# Patient Record
Sex: Female | Born: 1950 | Race: White | Hispanic: No | Smoking: Current every day smoker
Health system: Southern US, Community
[De-identification: ages and names within clinical notes are randomized; demographics above are authoritative.]

## PROBLEM LIST (undated history)

## (undated) DIAGNOSIS — Z973 Presence of spectacles and contact lenses: Secondary | ICD-10-CM

## (undated) DIAGNOSIS — N8110 Cystocele, unspecified: Secondary | ICD-10-CM

## (undated) DIAGNOSIS — J410 Simple chronic bronchitis: Secondary | ICD-10-CM

## (undated) DIAGNOSIS — Z8709 Personal history of other diseases of the respiratory system: Secondary | ICD-10-CM

## (undated) DIAGNOSIS — Z853 Personal history of malignant neoplasm of breast: Secondary | ICD-10-CM

---

## 1979-10-06 HISTORY — PX: OTHER SURGICAL HISTORY: SHX169

## 1979-10-15 HISTORY — PX: VAGINAL HYSTERECTOMY: SHX2639

## 1993-02-16 HISTORY — PX: MASTECTOMY WITH AXILLARY LYMPH NODE DISSECTION: SHX5661

## 1994-10-14 HISTORY — PX: OTHER SURGICAL HISTORY: SHX169

## 2013-09-01 ENCOUNTER — Other Ambulatory Visit: Payer: Self-pay | Admitting: Urology

## 2013-09-23 ENCOUNTER — Encounter (HOSPITAL_BASED_OUTPATIENT_CLINIC_OR_DEPARTMENT_OTHER): Payer: Self-pay | Admitting: *Deleted

## 2013-09-23 NOTE — Progress Notes (Signed)
NPO AFTER MN. ARRIVE AT 0830. NEEDS HG. REVIEWED [facility] GUIDELINES.

## 2013-09-27 ENCOUNTER — Ambulatory Visit (HOSPITAL_BASED_OUTPATIENT_CLINIC_OR_DEPARTMENT_OTHER)
Admission: RE | Admit: 2013-09-27 | Discharge: 2013-09-28 | Disposition: A | Discharge status: Home / Self Care | Payer: 59 | Source: Ambulatory Visit | Attending: Urology | Admitting: Urology

## 2013-09-27 ENCOUNTER — Encounter (HOSPITAL_BASED_OUTPATIENT_CLINIC_OR_DEPARTMENT_OTHER): Payer: Self-pay | Admitting: Anesthesiology

## 2013-09-27 ENCOUNTER — Encounter (HOSPITAL_BASED_OUTPATIENT_CLINIC_OR_DEPARTMENT_OTHER)
Admission: RE | Disposition: A | Discharge status: Home / Self Care | Payer: Self-pay | Source: Ambulatory Visit | Attending: Urology

## 2013-09-27 ENCOUNTER — Ambulatory Visit (HOSPITAL_BASED_OUTPATIENT_CLINIC_OR_DEPARTMENT_OTHER): Payer: 59 | Admitting: Anesthesiology

## 2013-09-27 ENCOUNTER — Encounter (HOSPITAL_BASED_OUTPATIENT_CLINIC_OR_DEPARTMENT_OTHER): Payer: 59 | Admitting: Anesthesiology

## 2013-09-27 DIAGNOSIS — N816 Rectocele: Secondary | ICD-10-CM | POA: Insufficient documentation

## 2013-09-27 DIAGNOSIS — R350 Frequency of micturition: Secondary | ICD-10-CM | POA: Insufficient documentation

## 2013-09-27 DIAGNOSIS — R351 Nocturia: Secondary | ICD-10-CM | POA: Insufficient documentation

## 2013-09-27 DIAGNOSIS — Z9079 Acquired absence of other genital organ(s): Secondary | ICD-10-CM | POA: Insufficient documentation

## 2013-09-27 DIAGNOSIS — J42 Unspecified chronic bronchitis: Secondary | ICD-10-CM | POA: Insufficient documentation

## 2013-09-27 DIAGNOSIS — R3911 Hesitancy of micturition: Secondary | ICD-10-CM | POA: Insufficient documentation

## 2013-09-27 DIAGNOSIS — Z853 Personal history of malignant neoplasm of breast: Secondary | ICD-10-CM | POA: Insufficient documentation

## 2013-09-27 DIAGNOSIS — Z90710 Acquired absence of both cervix and uterus: Secondary | ICD-10-CM | POA: Insufficient documentation

## 2013-09-27 DIAGNOSIS — N8111 Cystocele, midline: Secondary | ICD-10-CM | POA: Insufficient documentation

## 2013-09-27 DIAGNOSIS — R3915 Urgency of urination: Secondary | ICD-10-CM | POA: Insufficient documentation

## 2013-09-27 DIAGNOSIS — N3289 Other specified disorders of bladder: Secondary | ICD-10-CM | POA: Insufficient documentation

## 2013-09-27 DIAGNOSIS — F17200 Nicotine dependence, unspecified, uncomplicated: Secondary | ICD-10-CM | POA: Insufficient documentation

## 2013-09-27 DIAGNOSIS — N8110 Cystocele, unspecified: Secondary | ICD-10-CM | POA: Diagnosis present

## 2013-09-27 DIAGNOSIS — M533 Sacrococcygeal disorders, not elsewhere classified: Secondary | ICD-10-CM | POA: Insufficient documentation

## 2013-09-27 HISTORY — DX: Cystocele, unspecified: N81.10

## 2013-09-27 HISTORY — DX: Simple chronic bronchitis: J41.0

## 2013-09-27 HISTORY — DX: Presence of spectacles and contact lenses: Z97.3

## 2013-09-27 HISTORY — PX: CYSTOCELE REPAIR: SHX163

## 2013-09-27 HISTORY — DX: Personal history of malignant neoplasm of breast: Z85.3

## 2013-09-27 HISTORY — DX: Personal history of other diseases of the respiratory system: Z87.09

## 2013-09-27 LAB — POCT HEMOGLOBIN-HEMACUE: Hemoglobin: 14.3 g/dL (ref 12.0–15.0)

## 2013-09-27 SURGERY — COLPORRHAPHY, ANTERIOR, FOR CYSTOCELE REPAIR
Anesthesia: General | Site: Vagina

## 2013-09-27 MED ORDER — PHENAZOPYRIDINE HCL 200 MG PO TABS
200.0000 mg | ORAL_TABLET | Freq: Once | ORAL | Status: AC
  Administered 2013-09-27: 200 mg via ORAL
  Filled 2013-09-27: qty 1

## 2013-09-27 MED ORDER — MEPERIDINE HCL 25 MG/ML IJ SOLN
6.2500 mg | INTRAMUSCULAR | Status: DC | PRN
  Filled 2013-09-27: qty 1

## 2013-09-27 MED ORDER — PROPOFOL 10 MG/ML IV BOLUS
INTRAVENOUS | Status: DC | PRN
  Administered 2013-09-27: 150 mg via INTRAVENOUS
  Administered 2013-09-27: 20 mg via INTRAVENOUS

## 2013-09-27 MED ORDER — FENTANYL CITRATE 0.05 MG/ML IJ SOLN
INTRAMUSCULAR | Status: DC | PRN
  Administered 2013-09-27: 50 ug via INTRAVENOUS
  Administered 2013-09-27: 25 ug via INTRAVENOUS
  Administered 2013-09-27: 50 ug via INTRAVENOUS
  Administered 2013-09-27: 25 ug via INTRAVENOUS
  Administered 2013-09-27: 50 ug via INTRAVENOUS

## 2013-09-27 MED ORDER — DEXAMETHASONE SODIUM PHOSPHATE 4 MG/ML IJ SOLN
INTRAMUSCULAR | Status: DC | PRN
  Administered 2013-09-27: 10 mg via INTRAVENOUS

## 2013-09-27 MED ORDER — ONDANSETRON HCL 4 MG/2ML IJ SOLN
INTRAMUSCULAR | Status: DC | PRN
  Administered 2013-09-27: 4 mg via INTRAVENOUS

## 2013-09-27 MED ORDER — ZOLPIDEM TARTRATE 5 MG PO TABS
5.0000 mg | ORAL_TABLET | Freq: Every evening | ORAL | Status: DC | PRN
  Filled 2013-09-27: qty 1

## 2013-09-27 MED ORDER — OXYCODONE-ACETAMINOPHEN 5-325 MG PO TABS
1.0000 | ORAL_TABLET | ORAL | Status: DC | PRN
  Filled 2013-09-27: qty 2

## 2013-09-27 MED ORDER — LIDOCAINE HCL (CARDIAC) 20 MG/ML IV SOLN
INTRAVENOUS | Status: DC | PRN
  Administered 2013-09-27: 60 mg via INTRAVENOUS

## 2013-09-27 MED ORDER — OXYCODONE-ACETAMINOPHEN 5-325 MG PO TABS: 1.0000 | ORAL_TABLET | ORAL | Status: DC | PRN

## 2013-09-27 MED ORDER — ESTRADIOL 0.1 MG/GM VA CREA: TOPICAL_CREAM | VAGINAL | Status: DC | PRN

## 2013-09-27 MED ORDER — KETOROLAC TROMETHAMINE 30 MG/ML IJ SOLN
INTRAMUSCULAR | Status: DC | PRN
  Administered 2013-09-27: 30 mg via INTRAVENOUS

## 2013-09-27 MED ORDER — EPHEDRINE SULFATE 50 MG/ML IJ SOLN
INTRAMUSCULAR | Status: DC | PRN
  Administered 2013-09-27 (×5): 10 mg via INTRAVENOUS

## 2013-09-27 MED ORDER — BISACODYL 5 MG PO TBEC
5.0000 mg | DELAYED_RELEASE_TABLET | Freq: Every day | ORAL | Status: DC | PRN
  Filled 2013-09-27: qty 1

## 2013-09-27 MED ORDER — FENTANYL CITRATE 0.05 MG/ML IJ SOLN
INTRAMUSCULAR | Status: AC
  Filled 2013-09-27: qty 8

## 2013-09-27 MED ORDER — METOCLOPRAMIDE HCL 5 MG/ML IJ SOLN
INTRAMUSCULAR | Status: DC | PRN
  Administered 2013-09-27: 10 mg via INTRAVENOUS

## 2013-09-27 MED ORDER — PHENAZOPYRIDINE HCL 100 MG PO TABS
ORAL_TABLET | ORAL | Status: AC
  Filled 2013-09-27: qty 2

## 2013-09-27 MED ORDER — MIDAZOLAM HCL 5 MG/5ML IJ SOLN
INTRAMUSCULAR | Status: DC | PRN
  Administered 2013-09-27 (×2): 1 mg via INTRAVENOUS

## 2013-09-27 MED ORDER — CIPROFLOXACIN IN D5W 400 MG/200ML IV SOLN
400.0000 mg | INTRAVENOUS | Status: AC
  Administered 2013-09-27: 400 mg via INTRAVENOUS
  Filled 2013-09-27: qty 200

## 2013-09-27 MED ORDER — CIPROFLOXACIN HCL 500 MG PO TABS
ORAL_TABLET | ORAL | Status: AC
  Filled 2013-09-27: qty 1

## 2013-09-27 MED ORDER — HYDROMORPHONE HCL PF 1 MG/ML IJ SOLN
0.5000 mg | INTRAMUSCULAR | Status: DC | PRN
  Filled 2013-09-27: qty 1

## 2013-09-27 MED ORDER — BELLADONNA ALKALOIDS-OPIUM 16.2-60 MG RE SUPP
RECTAL | Status: AC
  Filled 2013-09-27: qty 1

## 2013-09-27 MED ORDER — CIPROFLOXACIN HCL 500 MG PO TABS
500.0000 mg | ORAL_TABLET | Freq: Two times a day (BID) | ORAL | Status: DC
  Administered 2013-09-27 – 2013-09-28 (×2): 500 mg via ORAL
  Filled 2013-09-27: qty 1

## 2013-09-27 MED ORDER — CIPROFLOXACIN HCL 500 MG PO TABS: 500.0000 mg | ORAL_TABLET | Freq: Two times a day (BID) | ORAL | Status: DC

## 2013-09-27 MED ORDER — BUPIVACAINE-EPINEPHRINE 0.5% -1:200000 IJ SOLN
INTRAMUSCULAR | Status: DC | PRN
  Administered 2013-09-27: 20 mL

## 2013-09-27 MED ORDER — SODIUM CHLORIDE 0.9 % IR SOLN
Status: DC | PRN
  Administered 2013-09-27: 12:00:00

## 2013-09-27 MED ORDER — DIPHENHYDRAMINE HCL 12.5 MG/5ML PO ELIX
12.5000 mg | ORAL_SOLUTION | Freq: Four times a day (QID) | ORAL | Status: DC | PRN
  Filled 2013-09-27: qty 5

## 2013-09-27 MED ORDER — BELLADONNA ALKALOIDS-OPIUM 16.2-60 MG RE SUPP
RECTAL | Status: DC | PRN
  Administered 2013-09-27: 1 via RECTAL

## 2013-09-27 MED ORDER — MIDAZOLAM HCL 2 MG/2ML IJ SOLN
INTRAMUSCULAR | Status: AC
  Filled 2013-09-27: qty 2

## 2013-09-27 MED ORDER — DIPHENHYDRAMINE HCL 50 MG/ML IJ SOLN
12.5000 mg | Freq: Four times a day (QID) | INTRAMUSCULAR | Status: DC | PRN
  Filled 2013-09-27: qty 0.25

## 2013-09-27 MED ORDER — PROMETHAZINE HCL 25 MG/ML IJ SOLN
6.2500 mg | INTRAMUSCULAR | Status: DC | PRN
  Filled 2013-09-27: qty 1

## 2013-09-27 MED ORDER — ACETAMINOPHEN 10 MG/ML IV SOLN
INTRAVENOUS | Status: DC | PRN
  Administered 2013-09-27: 1000 mg via INTRAVENOUS

## 2013-09-27 MED ORDER — SODIUM CHLORIDE 0.9 % IJ SOLN
INTRAMUSCULAR | Status: DC | PRN
  Administered 2013-09-27: 50 mL via INTRAVENOUS

## 2013-09-27 MED ORDER — LACTATED RINGERS IV SOLN
INTRAVENOUS | Status: DC
  Administered 2013-09-27 (×2): via INTRAVENOUS
  Filled 2013-09-27: qty 1000

## 2013-09-27 MED ORDER — ONDANSETRON HCL 4 MG/2ML IJ SOLN
4.0000 mg | INTRAMUSCULAR | Status: DC | PRN
  Filled 2013-09-27: qty 2

## 2013-09-27 MED ORDER — LACTATED RINGERS IV SOLN
INTRAVENOUS | Status: DC
  Filled 2013-09-27: qty 1000

## 2013-09-27 MED ORDER — FENTANYL CITRATE 0.05 MG/ML IJ SOLN
25.0000 ug | INTRAMUSCULAR | Status: DC | PRN
  Filled 2013-09-27: qty 1

## 2013-09-27 MED ORDER — SODIUM CHLORIDE 0.45 % IV SOLN
INTRAVENOUS | Status: DC
  Administered 2013-09-27 – 2013-09-28 (×2): via INTRAVENOUS
  Filled 2013-09-27: qty 1000

## 2013-09-27 MED ORDER — METRONIDAZOLE 0.75 % VA GEL
VAGINAL | Status: DC | PRN
  Administered 2013-09-27: 1 via VAGINAL

## 2013-09-27 MED ORDER — CIPROFLOXACIN IN D5W 400 MG/200ML IV SOLN
INTRAVENOUS | Status: AC
  Filled 2013-09-27: qty 200

## 2013-09-27 MED ORDER — STERILE WATER FOR IRRIGATION IR SOLN
Status: DC | PRN
  Administered 2013-09-27: 1000 mL

## 2013-09-27 SURGICAL SUPPLY — 60 items
BAG URINE DRAINAGE (UROLOGICAL SUPPLIES) ×2 IMPLANT
BLADE SURG 10 STRL SS (BLADE) ×2 IMPLANT
BLADE SURG 15 STRL LF DISP TIS (BLADE) ×1 IMPLANT
BLADE SURG 15 STRL SS (BLADE) ×1
BLADE SURG ROTATE 9660 (MISCELLANEOUS) ×2 IMPLANT
BOOTIES KNEE HIGH SLOAN (MISCELLANEOUS) IMPLANT
CANISTER SUCTION 1200CC (MISCELLANEOUS) IMPLANT
CANISTER SUCTION 2500CC (MISCELLANEOUS) ×4 IMPLANT
CATH FOLEY 2WAY SLVR  5CC 16FR (CATHETERS) ×1
CATH FOLEY 2WAY SLVR 5CC 16FR (CATHETERS) ×1 IMPLANT
CLOTH BEACON ORANGE TIMEOUT ST (SAFETY) ×2 IMPLANT
COVER LIGHT HANDLE  1/PK (MISCELLANEOUS) ×1
COVER LIGHT HANDLE 1/PK (MISCELLANEOUS) ×1 IMPLANT
COVER MAYO STAND STRL (DRAPES) ×2 IMPLANT
COVER TABLE BACK 60X90 (DRAPES) ×2 IMPLANT
DERMABOND ADVANCED (GAUZE/BANDAGES/DRESSINGS)
DERMABOND ADVANCED .7 DNX12 (GAUZE/BANDAGES/DRESSINGS) IMPLANT
DEVICE CAPIO SUTURING (INSTRUMENTS)
DEVICE CAPIO SUTURING OPC (INSTRUMENTS) IMPLANT
DISSECTOR ROUND CHERRY 3/8 STR (MISCELLANEOUS) IMPLANT
DRAPE CAMERA CLOSED 9X96 (DRAPES) ×2 IMPLANT
DRAPE SURG 17X23 STRL (DRAPES) ×4 IMPLANT
DRAPE UNDERBUTTOCKS STRL (DRAPE) ×2 IMPLANT
FLOSEAL 10ML (HEMOSTASIS) IMPLANT
GAUZE SPONGE 4X4 16PLY XRAY LF (GAUZE/BANDAGES/DRESSINGS) IMPLANT
GLOVE BIO SURGEON STRL SZ 6.5 (GLOVE) ×2 IMPLANT
GLOVE BIO SURGEON STRL SZ7.5 (GLOVE) ×8 IMPLANT
GLOVE INDICATOR 6.5 STRL GRN (GLOVE) ×2 IMPLANT
GOWN PREVENTION PLUS LG XLONG (DISPOSABLE) ×2 IMPLANT
GOWN STRL REIN XL XLG (GOWN DISPOSABLE) ×4 IMPLANT
NEEDLE 1/2 CIR CATGUT .05X1.09 (NEEDLE) ×2 IMPLANT
NEEDLE HYPO 22GX1.5 SAFETY (NEEDLE) ×2 IMPLANT
NS IRRIG 500ML POUR BTL (IV SOLUTION) ×2 IMPLANT
PACKING VAGINAL (PACKING) ×2 IMPLANT
PENCIL BUTTON HOLSTER BLD 10FT (ELECTRODE) ×2 IMPLANT
PLUG CATH AND CAP STER (CATHETERS) ×2 IMPLANT
RETRACTOR LONRSTAR 16.6X16.6CM (MISCELLANEOUS) ×1 IMPLANT
RETRACTOR STAY HOOK 5MM (MISCELLANEOUS) ×2 IMPLANT
RETRACTOR STER APS 16.6X16.6CM (MISCELLANEOUS) ×2
SET IRRIG Y TYPE TUR BLADDER L (SET/KITS/TRAYS/PACK) ×2 IMPLANT
SHEET LAVH (DRAPES) ×2 IMPLANT
SLING SOLYX SYSTEM SIS BX (SLING) IMPLANT
SPONGE LAP 4X18 X RAY DECT (DISPOSABLE) ×2 IMPLANT
SUCTION FRAZIER TIP 10 FR DISP (SUCTIONS) ×2 IMPLANT
SUT CAPIO POLYGLYCOLIC (SUTURE) IMPLANT
SUT ETHILON 2 0 PS N (SUTURE) IMPLANT
SUT MON AB 2-0 SH 27 (SUTURE)
SUT MON AB 2-0 SH27 (SUTURE) IMPLANT
SUT NONABSORB MONO DB W/NDL 48 (SUTURE) ×4 IMPLANT
SUT SILK 3 0 PS 1 (SUTURE) IMPLANT
SUT VIC AB 2-0 UR6 27 (SUTURE) ×26 IMPLANT
SYR BULB IRRIGATION 50ML (SYRINGE) ×2 IMPLANT
SYR CONTROL 10ML LL (SYRINGE) ×4 IMPLANT
SYRINGE 10CC LL (SYRINGE) ×2 IMPLANT
SYS PRF KIT VAG SUP UPHOLD (Sling) ×2 IMPLANT
TISSUE REPAIR XENFORM 6X10CM (Tissue) ×2 IMPLANT
TRAY DSU PREP LF (CUSTOM PROCEDURE TRAY) ×2 IMPLANT
TUBE CONNECTING 12X1/4 (SUCTIONS) ×4 IMPLANT
WATER STERILE IRR 500ML POUR (IV SOLUTION) ×2 IMPLANT
YANKAUER SUCT BULB TIP NO VENT (SUCTIONS) IMPLANT

## 2013-09-27 NOTE — Op Note (Signed)
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  Surgeon:  Kathie Rhodes. Patsi Sears, MD  First assistant:  Dr. Lorin Picket MacDiarmid  Anesthesia:  General LMA; Marcaine, 2% with epinephrine, 1:200,000, 80cc.   Preparation: After appropriate preanesthesia, the patient was brought to the operative room, placed in the upper table in the dorsal supine position where general LMA anesthesia was introduced. She was replaced in dorsal lithotomy position with pubis was prepped with Betadine solution and draped in usual fashion.  Review history:  female who underwent hysterectomy in 1981, and with "bladder tack" at that time. She subsequently underwent bilateral salpingo-oophorectomy 1996. She has complained of vaginal pressure and protrusion for several years, but things have "worsened" over the last several months. She complains of urinary frequency, urgency and nocturia. She has obstructive voiding symptoms, mushy decompress the cystocele (dictation), at which point she can void freely, and without difficulty. Otherwise, the patient complains of urinary hesitancy, and weak stream. Her biggest complaint of vaginal pressure protrusion, which she feels comment visually appreciates her prolapse.  Urodynamics shows a capacity of 850 cc first sensation at 395 cc. Leak point pressure is 119 cm water with no leakage with or without reduction of her prolapse.  Pressure flow studies accomplished, and shows void on the greater 26 cc with maximum flow of 20 cc per second. Detrusor pressure at maximum flow was 29 cm water. She is now for anterior vault prolapse  repair.   Statement of  Likelihood of Success: Excellent. TIME-OUT observed.:  Procedure:   Vaginal inspection revealed complete eversion of the patient's bladder, with high suspicion of enterocele. The apex was marked with a 2-0 Vicryl suture, and photodocumentation was accomplished. The patient's anterior vaginal vault was everted 10 cm, 7 cm from the introitus. Inspection revealed a very full rectocele, with a suggestion of a enterocele. The patient previous he had hysterectomy. Dr. Jacquelyne Balint was called, and responded to first assistant.  A blue marking pen was used to outline the bladder neck, and area for horizontal incision for anterior vault suspension. 50 cc of the Marcaine/epinephrine solution was then injected in order to afford postoperative analgesia, but also to afford Hydro dissection. A 6 cm horizontal incision is made and subcutaneous tissue was dissected with sharp and blunt dissection. Kelly plication was accomplished using 2-0 Vicryl horizontal mattress sutures. Dissection was accomplished into the pelvic floor, and the ischial spines were identified bilaterally. The sidewalls were dissected, so that I could palpate the sacrospinous ligaments bilaterally. 2 fingers were used to dissected the pelvic sidewall. Using the U. device, I was able to place the U. suture medially on the sacrospinous ligament bilaterally. No bleeding was noted. Following this, the uphold light mesh was placed without difficulty, against the anterior vaginal wall, and without tension. No blood sling of the mesh was noted. The mesh was sutured both proximalward, and distalward, with 2-0 Vicryl suture. The arms of the uphold light were then brought across the midline, and cut in the usual fashion and removed. All plastic and excess arms were removed.  With the apex in vault suspended, the wound was closed with running 2-0 Vicryl suture.  After irrigation, cystoscopy was accomplished, and showed excellent urine output  from both ureteral orifices. The patient had previously been given Parenti and both night before surgery, and just prior surgery. This was accomplished for easier identification of urine output.  Vaginal inspection now revealed a large rectocele. Using a marking pen, midline incision was outlined, and 30 cc of the Marcaine and epinephrine solution was used for hydrodissection and postoperative analgesia. This was injected, and incision was made measuring 8 cm. Following dissection with sharp and blunt dissection, I elected to dissect posteriorly into the pelvic floor, and again, the ischial spines were identified, and the sacrospinous ligament was identified. Posterior rectal plication was accomplished using interrupted 2-0 Vicryl sutures. Rectal examination showed no evidence of any rectal perforation.    The Cappio device was used to place sutures medially, and the Cappio device would be used to place sutures in a more lateral position on the sacrospinus ligament. A 10 cm portion of Xenform was selected, and transformed into a trapezoid appearance. The Cappio device was then used to place sutures through the lateral portion of the sacrospinous ligament, and using a free needle, sutures were placed through the Xenform tissue. The biologic tissue was easily put into position, and sutured in place with 3-0 Vicryl suture. This is accomplished in the distal portion of the wound.  The patient still appeared to have distal apical weakness, and this required 2 separate 2-0 Vicryl sutures, sutured individually to the apical mesh. This allowed the posterior apex to be sutured to the anterior apex. Following this, the patient had true apex vault repair.  Posterior vaginal epithelium was then closed with running 2-0 Vicryl suture. Repeat cystoscopy again showed excellent urine output from both ureteral orifices. The patient received IV Toradol, as well as IV Tylenol. She was awakened, and taken to recovery room in good  condition.

## 2013-09-27 NOTE — Anesthesia Preprocedure Evaluation (Addendum)
Anesthesia Evaluation  Patient identified by MRN, date of birth, ID band Patient awake    Reviewed: Allergy & Precautions, H&P , NPO status , Patient's Chart, lab work & pertinent test results  Airway Mallampati: II TM Distance: >3 FB Neck ROM: Full    Dental no notable dental hx.    Pulmonary COPDCurrent Smoker,  breath sounds clear to auscultation  Pulmonary exam normal       Cardiovascular negative cardio ROS  Rhythm:Regular Rate:Normal     Neuro/Psych negative neurological ROS  negative psych ROS   GI/Hepatic negative GI ROS, Neg liver ROS,   Endo/Other  negative endocrine ROS  Renal/GU negative Renal ROS  negative genitourinary   Musculoskeletal negative musculoskeletal ROS (+)   Abdominal   Peds negative pediatric ROS (+)  Hematology negative hematology ROS (+)   Anesthesia Other Findings   Reproductive/Obstetrics negative OB ROS                          Anesthesia Physical Anesthesia Plan  ASA: III  Anesthesia Plan: General   Post-op Pain Management:    Induction: Intravenous  Airway Management Planned: LMA  Additional Equipment:   Intra-op Plan:   Post-operative Plan: Extubation in OR  Informed Consent: I have reviewed the patients History and Physical, chart, labs and discussed the procedure including the risks, benefits and alternatives for the proposed anesthesia with the patient or authorized representative who has indicated his/her understanding and acceptance.   Dental advisory given  Plan Discussed with: CRNA  Anesthesia Plan Comments:         Anesthesia Quick Evaluation

## 2013-09-27 NOTE — Interval H&P Note (Signed)
History and Physical Interval Note:  09/27/2013 10:12 AM  Sandra Nielsen  has presented today for surgery, with the diagnosis of CYSTOCELE  The various methods of treatment have been discussed with the patient and family. After consideration of risks, benefits and other options for treatment, the patient has consented to  Procedure(s): BOSTON SCIENTIFIC UPHOLD LITE SACROSPINOUS LIGAMENT REPAIR  (N/A) as a surgical intervention .  The patient's history has been reviewed, patient examined, no change in status, stable for surgery.  I have reviewed the patient's chart and labs.  Questions were answered to the patient's satisfaction.     Jethro Bolus I

## 2013-09-27 NOTE — H&P (Signed)
  Sandra Nielsen is an 62 y.o. female.    HPI:   62 year old female who underwent hysterectomy in 1981, and with "bladder tack" at that time. She subsequently underwent bilateral salpingo-oophorectomy 1996. She has complained of vaginal pressure and protrusion for several years, but things have "worsened" over the last several months. She complains of urinary frequency, urgency and nocturia. She has obstructive voiding symptoms, mushy decompress the cystocele (dictation), at which point she can void freely, and without difficulty. Otherwise, the patient complains of urinary hesitancy, and weak stream. Her biggest complaint of vaginal pressure protrusion, which she feels comment visually appreciates her prolapse.  Urodynamics shows a capacity of 850 cc first sensation at 395 cc. Leak point pressure is 119 cm water with no leakage with or without reduction of her prolapse.  Pressure flow studies accomplished, and shows void on the greater 26 cc with maximum flow of 20 cc per second. Detrusor pressure at maximum flow was 29 cm water. She is now for anterior vault prolapse repair.  Past Medical History  Diagnosis Date  . Smokers' cough   . History of chronic bronchitis   . History of breast cancer     S/P RIGHT MASTECTOMY / NO CHEMORADIATION/   NO RECURRENCE  . Female cystocele   . Wears glasses     Past Surgical History  Procedure Laterality Date  . Left ankle reconstruction  10-06-1979  . Vaginal hysterectomy  1981  . Mastectomy with axillary lymph node dissection Right 02-16-1993  . Laparotomy w/ bilateral salpingoophorectomy  JAN 1996    Medications Prior to Admission  Medication Sig Dispense Refill  . Polyethylene Glycol 3350 (MIRALAX PO) Take by mouth daily.        Allergies:  Allergies  Allergen Reactions  . Penicillins Hives and Swelling    History reviewed. No pertinent family history.  Social History:  reports that she has been smoking Cigarettes.  She has a 40 pack-year  smoking history. She has never used smokeless tobacco. She reports that she does not drink alcohol or use illicit drugs.  Review of Systems: Pertinent items are noted in HPI. A comprehensive review of systems was negative except for: as above.   No results found for this or any previous visit (from the past 48 hour(s)).  No results found.  Temp:  [97.5 F (36.4 C)] 97.5 F (36.4 C) (12/15 0837) Pulse Rate:  [75] 75 (12/15 0837) Resp:  [16] 16 (12/15 0837) BP: (129)/(68) 129/68 mmHg (12/15 0837) SpO2:  [99 %] 99 % (12/15 0837) Weight:  [78.245 kg (172 lb 8 oz)] 78.245 kg (172 lb 8 oz) (12/15 0981)  Physical Exam: General appearance: alert and appears stated age Head: Normocephalic, without obvious abnormality, atraumatic Eyes: conjunctivae/corneas clear. EOM's intact.  Oropharynx: moist mucous membranes Neck: supple, symmetrical, trachea midline Resp: normal respiratory effort Cardio: regular rate and rhythm Back: symmetric, no curvature. ROM normal. No CVA tenderness. GI: soft, non-tender; bowel sounds normal; no masses,  no organomegaly Female genitalia: : normal  Female gena\italia, with no lesions or discharge.. no hernias Pelvic:Stage III prolapse as noted above.  Extremities: extremities normal, atraumatic, no cyanosis or edema Skin: Skin color normal. No visible rashes or lesions Neurologic: Grossly normal  Assessment/Plan Stage III prolapse, For repair this AM. Discussed options.  Sandra Nielsen I 09/27/2013, 9:54 AM

## 2013-09-27 NOTE — Transfer of Care (Signed)
Immediate Anesthesia Transfer of Care Note  Patient: Sandra Nielsen  Procedure(s) Performed: Procedure(s) (LRB): BOSTON SCIENTIFIC UPHOLD LITE SACROSPINOUS LIGAMENT REPAIR , Rectocele Repair with Xenform, Cystocele repair with Xenform, Vaginal Vault Suspension, Kelly Plication (N/A)  Patient Location: PACU  Anesthesia Type: General  Level of Consciousness: awake, alert  and oriented  Airway & Oxygen Therapy: Patient Spontanous Breathing and Patient connected to face mask oxygen  Post-op Assessment: Report given to PACU RN and Post -op Vital signs reviewed and stable  Post vital signs: Reviewed and stable  Complications: No apparent anesthesia complications

## 2013-09-27 NOTE — Interval H&P Note (Signed)
History and Physical Interval Note:  09/27/2013 10:12 AM  Sandra Nielsen  has presented today for surgery, with the diagnosis of CYSTOCELE  The various methods of treatment have been discussed with the patient and family. After consideration of risks, benefits and other options for treatment, the patient has consented to  Procedure(s): BOSTON SCIENTIFIC UPHOLD LITE SACROSPINOUS LIGAMENT REPAIR  (N/A) as a surgical intervention .  The patient's history has been reviewed, patient examined, no change in status, stable for surgery.  I have reviewed the patient's chart and labs.  Questions were answered to the patient's satisfaction.     [name]   

## 2013-09-27 NOTE — Anesthesia Procedure Notes (Addendum)
Procedure Name: LMA Insertion Date/Time: 09/27/2013 10:21 AM Performed by: Norva Pavlov Pre-anesthesia Checklist: Patient identified, Emergency Drugs available, Suction available and Patient being monitored Patient Re-evaluated:Patient Re-evaluated prior to inductionOxygen Delivery Method: Circle System Utilized Preoxygenation: Pre-oxygenation with 100% oxygen Intubation Type: IV induction Ventilation: Mask ventilation without difficulty LMA: LMA inserted LMA Size: 4.0 Number of attempts: 1 Airway Equipment and Method: bite block Placement Confirmation: positive ETCO2 Tube secured with: Tape Dental Injury: Teeth and Oropharynx as per pre-operative assessment

## 2013-09-27 NOTE — Anesthesia Postprocedure Evaluation (Signed)
  Anesthesia Post-op Note  Patient: Sandra Nielsen  Procedure(s) Performed: Procedure(s) (LRB): BOSTON SCIENTIFIC UPHOLD LITE SACROSPINOUS LIGAMENT REPAIR , Rectocele Repair with Xenform, Cystocele repair with Xenform, Vaginal Vault Suspension, Kelly Plication (N/A)  Patient Location: PACU  Anesthesia Type: General  Level of Consciousness: awake and alert   Airway and Oxygen Therapy: Patient Spontanous Breathing  Post-op Pain: mild  Post-op Assessment: Post-op Vital signs reviewed, Patient's Cardiovascular Status Stable, Respiratory Function Stable, Patent Airway and No signs of Nausea or vomiting  Last Vitals:  Filed Vitals:   09/27/13 1313  BP: 124/58  Pulse: 74  Temp: 36.4 C  Resp: 16    Post-op Vital Signs: stable   Complications: No apparent anesthesia complications

## 2013-09-28 ENCOUNTER — Encounter (HOSPITAL_BASED_OUTPATIENT_CLINIC_OR_DEPARTMENT_OTHER): Payer: Self-pay | Admitting: Urology

## 2013-09-28 MED ORDER — CIPROFLOXACIN HCL 250 MG PO TABS
ORAL_TABLET | ORAL | Status: AC
  Filled 2013-09-28: qty 2

## 2013-09-28 NOTE — Progress Notes (Signed)
Foley catheter and vaginal packing removed per order.  Minimal vaginal bleeding noted.  Pt tolerated procedure well.  peripad and mesh panties replaced.

## 2013-09-28 NOTE — Progress Notes (Signed)
Urology Progress Note  1 Day Post-Op  No perineal pain. Minimal coccygeal pain, c/w hx of coccyx fracture ( remote). + spotting, mild. + void.  Subjective:     No acute urologic events overnight. Ambulation:   positive Flatus:    positive Bowel movement  positive  Pain: complete resolution  Objective:  Blood pressure 100/71, pulse 70, temperature 97.6 F (36.4 C), temperature source Oral, resp. rate 18, height 5\' 9"  (1.753 m), weight 78.245 kg (172 lb 8 oz), SpO2 96.00%.  Physical Exam:  General:  No acute distress, awake Resp: clear to auscultation bilaterally Genitourinary:  Normal BUS.  Foley: out    I/O last 3 completed shifts: In: 4287.5 [P.O.:1440; I.V.:2847.5] Out: 2475 [Urine:2475]  Recent Labs     09/27/13  0935  HGB  14.3    No results found for this basename: NA, K, CL, CO2, BUN, CREATININE, CALCIUM, MAGNESIUM, GFRNONAA, GFRAA,  in the last 72 hours   No results found for this basename: PT, INR, APTT,  in the last 72 hours   No components found with this basename: ABG,   Assessment/Plan:  Wound care discussed. Activity: discussed/ no heavy lifting.  Drive next week.  Avoid constipation Stop smoking.

## 2013-09-28 NOTE — Discharge Summary (Signed)
  Physician Discharge Summary  Patient ID: Sandra Nielsen MRN: [id number] DOB/AGE: 14-Nov-1950 63 y.o.  Admit date: 09/27/2013 Discharge date: 09/28/2013  Admission Diagnoses: CYSTOCELE  Discharge Diagnoses:  Active Problems:   Prolapse of anterior vaginal wall   Discharged Condition: Stable  Hospital Course:   Surgery  Consults: nopne  Significant Diagnostic Studies: No results found.  Treatments:Surgery  Discharge Exam: Blood pressure 100/71, pulse 70, temperature 97.6 F (36.4 C), temperature source Oral, resp. rate 18, height 5\' 9"  (1.753 m), weight 78.245 kg (172 lb 8 oz), SpO2 96.00%. General appearance: alert and cooperative Pelvic: cervix normal in appearance, no adnexal masses or tenderness, no cervical motion tenderness, rectovaginal septum normal, uterus normal size, shape, and consistency and vagina normal without discharge  Disposition: Final discharge disposition not confirmed  Discharge Orders   Future Orders Complete By Expires   Discharge patient  As directed    Discontinue IV  As directed        Medication List         ciprofloxacin 500 MG tablet  Commonly known as:  CIPRO  Take 1 tablet (500 mg total) by mouth 2 (two) times daily.     MIRALAX PO  Take by mouth daily.     oxyCODONE-acetaminophen 5-325 MG per tablet  Commonly known as:  ROXICET  Take 1 tablet by mouth every 4 (four) hours as needed for severe pain.           Follow-up Information   Follow up with Kathi Ludwig, MD.   Specialty:  Urology   Contact information:   [address] [place] [address] [phone]       Follow up with Jethro Bolus I, MD. (per appointment)    Specialty:  Urology   Contact information:   [address], [place] [address] [phone]     Stop smoking Avoid constipation  Signed: Jethro Bolus  I 09/28/2013, 8:43 AM

## 2014-11-13 ENCOUNTER — Emergency Department (HOSPITAL_COMMUNITY)
Admission: EM | Admit: 2014-11-13 | Discharge: 2014-11-13 | Disposition: A | Discharge status: Home / Self Care | Payer: 59 | Source: Home / Self Care | Attending: Family Medicine | Admitting: Family Medicine

## 2014-11-13 ENCOUNTER — Encounter (HOSPITAL_COMMUNITY): Payer: Self-pay | Admitting: *Deleted

## 2014-11-13 DIAGNOSIS — B029 Zoster without complications: Secondary | ICD-10-CM

## 2014-11-13 MED ORDER — HYDROCODONE-ACETAMINOPHEN 5-325 MG PO TABS: 1.0000 | ORAL_TABLET | Freq: Four times a day (QID) | ORAL | Status: DC | PRN

## 2014-11-13 MED ORDER — VALACYCLOVIR HCL 1 G PO TABS: 1000.0000 mg | ORAL_TABLET | Freq: Three times a day (TID) | ORAL | Status: DC

## 2014-11-13 NOTE — ED Provider Notes (Signed)
CSN: [id number]     Arrival date & time 11/13/14  7829 History   First MD Initiated Contact with Patient 11/13/14 1023     Chief Complaint  Patient presents with  . Herpes Zoster   (Consider location/radiation/quality/duration/timing/severity/associated sxs/prior Treatment) HPI Comments: Patient states she developed pain at right posterior shoulder 2 days ago and woke this morning with rash in same region along with few scattered areas of rash at right axilla and right anterior chest wall. No previous episodes and feels otherwise well. PCP: [facility].   The history is provided by the patient.    Past Medical History  Diagnosis Date  . Smokers' cough   . History of chronic bronchitis   . History of breast cancer     S/P RIGHT MASTECTOMY / NO CHEMORADIATION/   NO RECURRENCE  . Female cystocele   . Wears glasses    Past Surgical History  Procedure Laterality Date  . Left ankle reconstruction  10-06-1979  . Vaginal hysterectomy  1981  . Mastectomy with axillary lymph node dissection Right 02-16-1993  . Laparotomy w/ bilateral salpingoophorectomy  JAN 1996  . Cystocele repair N/A 09/27/2013    Procedure: BOSTON SCIENTIFIC UPHOLD LITE SACROSPINOUS LIGAMENT REPAIR , Rectocele Repair with Xenform, Cystocele repair with Xenform, Vaginal Vault Suspension, Joelene Millin;  Surgeon: Kathi Ludwig, MD;  Location: [facility];  Service: Urology;  Laterality: N/A;   No family history on file. History  Substance Use Topics  . Smoking status: Current Every Day Smoker -- 1.00 packs/day for 40 years    Types: Cigarettes  . Smokeless tobacco: Never Used  . Alcohol Use: No   OB History    No data available     Review of Systems  All other systems reviewed and are negative.   Allergies  Penicillins  Home Medications   Prior to Admission medications   Medication Sig Start Date End Date Taking? Authorizing Provider  ciprofloxacin (CIPRO) 500 MG  tablet Take 1 tablet (500 mg total) by mouth 2 (two) times daily. 09/27/13   Kathi Ludwig, MD  HYDROcodone-acetaminophen (NORCO/VICODIN) 5-325 MG per tablet Take 1-2 tablets by mouth every 6 (six) hours as needed for moderate pain or severe pain. 11/13/14   Mathis Fare Shondell Fabel, PA  oxyCODONE-acetaminophen (ROXICET) 5-325 MG per tablet Take 1 tablet by mouth every 4 (four) hours as needed for severe pain. 09/27/13   Kathi Ludwig, MD  Polyethylene Glycol 3350 (MIRALAX PO) Take by mouth daily.    Historical Provider, MD  valACYclovir (VALTREX) 1000 MG tablet Take 1 tablet (1,000 mg total) by mouth 3 (three) times daily. X 7 days 11/13/14   Jess Barters H Demeka Sutter, PA   BP 128/85 mmHg  Pulse 99  Temp(Src) 98.7 F (37.1 C) (Oral)  Resp 16  SpO2 99% Physical Exam  Constitutional: She is oriented to person, place, and time. She appears well-developed and well-nourished.  HENT:  Head: Normocephalic and atraumatic.  Cardiovascular: Normal rate.   Pulmonary/Chest: Effort normal.  Musculoskeletal: Normal range of motion.  Neurological: She is alert and oriented to person, place, and time.  Skin: Skin is warm and dry. Rash noted.  +herpes zoster rash along right T2-3 dermatome  Psychiatric: She has a normal mood and affect. Her behavior is normal.  Nursing note and vitals reviewed.   ED Course  Procedures (including critical care time) Labs Review Labs Reviewed - No data to display  Imaging Review No results found.  MDM   1. Herpes zoster    Valtrex and Vicodin as directed with PCP follow up.   Ria ClockJennifer Lee H Bryndan Bilyk, PA 11/13/14 1051

## 2014-11-13 NOTE — Discharge Instructions (Signed)

## 2014-11-13 NOTE — ED Notes (Signed)
Started with some discomfort over left breast area and in right scapular region 2 days ago.  Yesterday noticed red, splotchy, slightly pruritic, slightly burning rash to both areas.  Has not had shingles vaccine.

## 2015-01-09 ENCOUNTER — Emergency Department (INDEPENDENT_AMBULATORY_CARE_PROVIDER_SITE_OTHER)
Admission: EM | Admit: 2015-01-09 | Discharge: 2015-01-09 | Disposition: A | Discharge status: Home / Self Care | Payer: 59 | Source: Home / Self Care | Attending: Emergency Medicine | Admitting: Emergency Medicine

## 2015-01-09 ENCOUNTER — Encounter (HOSPITAL_COMMUNITY): Payer: Self-pay | Admitting: Emergency Medicine

## 2015-01-09 ENCOUNTER — Emergency Department (INDEPENDENT_AMBULATORY_CARE_PROVIDER_SITE_OTHER): Payer: 59

## 2015-01-09 DIAGNOSIS — J189 Pneumonia, unspecified organism: Secondary | ICD-10-CM | POA: Diagnosis not present

## 2015-01-09 IMAGING — DX DG CHEST 2V
2 series · 2 of 2 positions shown · non-contrast
Comparison: None.

CLINICAL DATA: Fever, diarrhea, weakness

EXAM:
CHEST  2 VIEW

[chest pa]
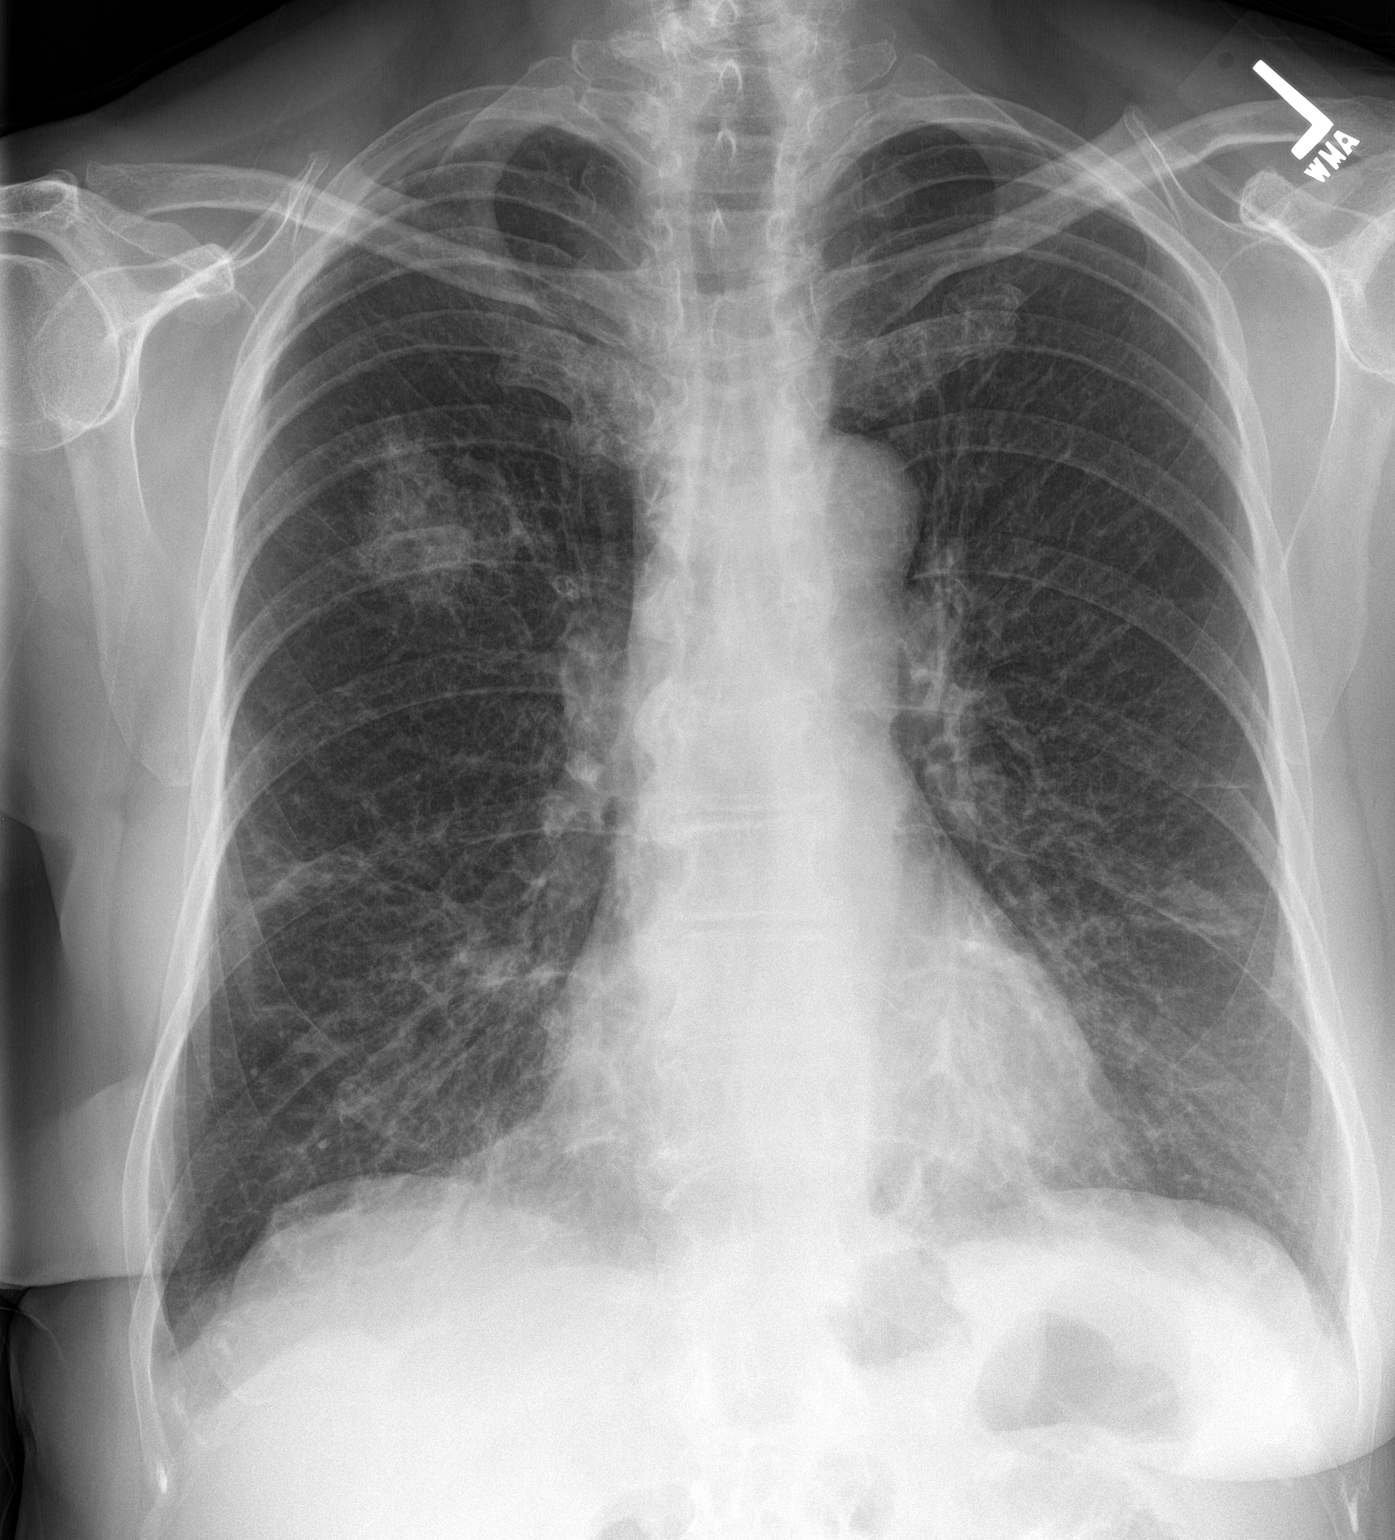

[chest lat]
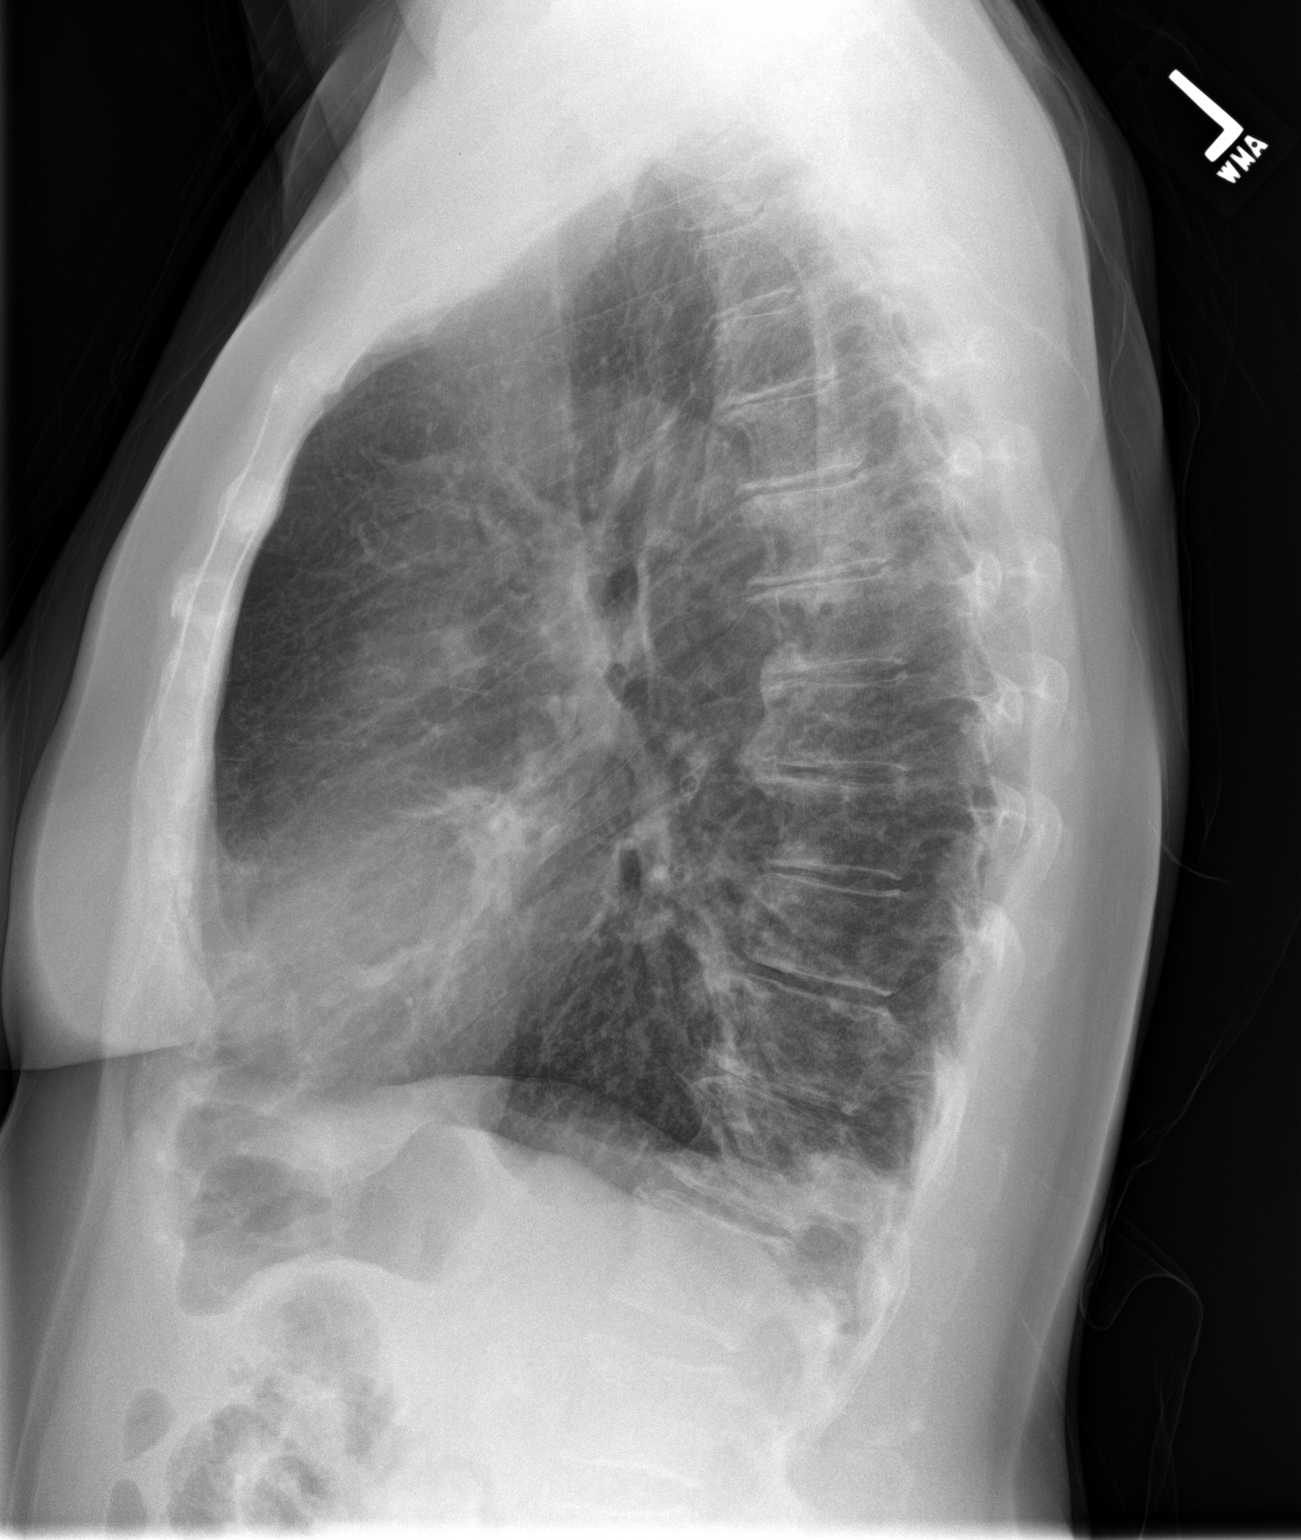

[2 of 2 positions shown; findings below may reference images not displayed]

FINDINGS: Focal spiculated airspace opacity in the right upper lobe. There is
right apical pleural thickening. There is bilateral lower lung
atelectasis versus scarring. The lungs are hyperinflated likely
secondary to COPD. There is no pleural effusion or pneumothorax. The
heart and mediastinal contours are unremarkable.

The osseous structures are unremarkable.
IMPRESSION: Focal spiculated airspace opacity in the right upper lobe measuring
approximately 4.2 cm. Differential diagnosis includes round
pneumonia versus neoplasm. Recommend followup radiography in 4-6
weeks, to document complete resolution following adequate medical
therapy. If there is not complete resolution, then recommend further
evaluation with CT of the chest to evaluate for underlying
pathology.

## 2015-01-09 MED ORDER — SODIUM CHLORIDE 0.9 % IV BOLUS (SEPSIS)
1000.0000 mL | Freq: Once | INTRAVENOUS | Status: AC
  Administered 2015-01-09: 1000 mL via INTRAVENOUS

## 2015-01-09 MED ORDER — PREDNISONE 50 MG PO TABS: ORAL_TABLET | ORAL | Status: DC

## 2015-01-09 MED ORDER — LEVOFLOXACIN 500 MG PO TABS: 500.0000 mg | ORAL_TABLET | Freq: Every day | ORAL | Status: DC

## 2015-01-09 NOTE — ED Notes (Signed)
Pt states that she has had fever, diarrhea and weakness since 01/06/2015

## 2015-01-09 NOTE — ED Provider Notes (Signed)
CSN: [id number]     Arrival date & time 01/09/15  9147 History   First MD Initiated Contact with Patient 01/09/15 1002     Chief Complaint  Patient presents with  . Fever  . Diarrhea  . Weakness   (Consider location/radiation/quality/duration/timing/severity/associated sxs/prior Treatment) HPI  She is a 64 year old woman here with her daughter for evaluation of diarrhea. She states this started about 3 days ago with fever and diarrhea. She also reports developing a cough in the last few days. The diarrhea is described as watery. No blood in it. She denies any abdominal pain, nausea, vomiting. She does report some discomfort around her lower rib cage. She states the fevers are starting to improve. She reports feeling weak and getting dizzy when she has been standing.  Past Medical History  Diagnosis Date  . Smokers' cough   . History of chronic bronchitis   . History of breast cancer     S/P RIGHT MASTECTOMY / NO CHEMORADIATION/   NO RECURRENCE  . Female cystocele   . Wears glasses    Past Surgical History  Procedure Laterality Date  . Left ankle reconstruction  10-06-1979  . Vaginal hysterectomy  1981  . Mastectomy with axillary lymph node dissection Right 02-16-1993  . Laparotomy w/ bilateral salpingoophorectomy  JAN 1996  . Cystocele repair N/A 09/27/2013    Procedure: [employer] SCI[employer]IFIC UPHOLD LITE SACROSPINOUS LIGAMENT REPAIR , Rectocele Repair with Xenform, Cystocele repair with Xenform, Vaginal Vault Suspension, Joelene Millin;  Surgeon: Kathi Ludwig, MD;  Location: [facility];  Service: Urology;  Laterality: N/A;   History reviewed. No pertinent family history. History  Substance Use Topics  . Smoking status: Current Every Day Smoker -- 1.00 packs/day for 40 years    Types: Cigarettes  . Smokeless tobacco: Never Used  . Alcohol Use: No   OB History    No data available     Review of Systems  Constitutional: Positive for fever, appetite change  and fatigue. Negative for chills.  HENT: Negative.   Respiratory: Positive for cough. Negative for shortness of breath.   Cardiovascular: Negative for chest pain.  Gastrointestinal: Positive for diarrhea. Negative for nausea, vomiting and abdominal pain.  Musculoskeletal: Negative for myalgias.  Neurological: Positive for dizziness and weakness.    Allergies  Penicillins  Home Medications   Prior to Admission medications   Medication Sig Start Date End Date Taking? Authorizing Provider  HYDROcodone-acetaminophen (NORCO/VICODIN) 5-325 MG per tablet Take 1-2 tablets by mouth every 6 (six) hours as needed for moderate pain or severe pain. 11/13/14   Mathis Fare Presson, PA  levofloxacin (LEVAQUIN) 500 MG tablet Take 1 tablet (500 mg total) by mouth daily. 01/09/15   Charm Rings, MD  oxyCODONE-acetaminophen (ROXICET) 5-325 MG per tablet Take 1 tablet by mouth every 4 (four) hours as needed for severe pain. 09/27/13   Jethro Bolus, MD  Polyethylene Glycol 3350 (MIRALAX PO) Take by mouth daily.    Historical Provider, MD  predniSONE (DELTASONE) 50 MG tablet Take 1 pill daily for 5 days. 01/09/15   Charm Rings, MD  valACYclovir (VALTREX) 1000 MG tablet Take 1 tablet (1,000 mg total) by mouth 3 (three) times daily. X 7 days 11/13/14   Jess Barters H Presson, PA   BP 103/55 mmHg  Pulse 98  Temp(Src) 98.9 F (37.2 C) (Temporal)  SpO2 90% Physical Exam  Constitutional: She is oriented to person, place, and time. She appears well-developed and well-nourished. No distress.  HENT:  Mouth/Throat: Mucous membranes are dry.  Neck: Neck supple.  Cardiovascular: Normal rate, regular rhythm and normal heart sounds.   No murmur heard. Pulmonary/Chest: Effort normal and breath sounds normal. No respiratory distress. She has no wheezes. She has no rales.  She has some coarse breath sounds, but no focal rales or wheezes.  Abdominal: Soft. Bowel sounds are normal. She exhibits no distension. There  is no tenderness. There is no rebound and no guarding.  Neurological: She is alert and oriented to person, place, and time.    ED Course  Procedures (including critical care time) Labs Review Labs Reviewed - No data to display  Imaging Review Dg Chest 2 View  01/09/2015   CLINICAL DATA:  Fever, diarrhea, weakness  EXAM: CHEST  2 VIEW  COMPARISON:  None.  FINDINGS: Focal spiculated airspace opacity in the right upper lobe. There is right apical pleural thickening. There is bilateral lower lung atelectasis versus scarring. The lungs are hyperinflated likely secondary to COPD. There is no pleural effusion or pneumothorax. The heart and mediastinal contours are unremarkable.  The osseous structures are unremarkable.  IMPRESSION: Focal spiculated airspace opacity in the right upper lobe measuring approximately 4.2 cm. Differential diagnosis includes round pneumonia versus neoplasm. Recommend followup radiography in 4-6 weeks, to document complete resolution following adequate medical therapy. If there is not complete resolution, then recommend further evaluation with CT of the chest to evaluate for underlying pathology.   Electronically Signed   By: Elige KoHetal  Patel   On: 01/09/2015 10:49     MDM   1. CAP (community acquired pneumonia)    Will give a 1 L normal saline bolus. She is feeling better after the bolus.  X-ray is concerning for pneumonia. We'll treat as CAP with Levaquin and prednisone. Emphasized importance of repeat chest x-ray in 4-6 weeks given her history of smoking. Return precautions reviewed.    Charm RingsErin J Mickie Badders, MD 01/09/15 1147

## 2015-01-09 NOTE — Discharge Instructions (Signed)
You have pneumonia. Take Levaquin daily for 7 days. Take prednisone daily for 5 days. If you have persistent fevers, or having trouble breathing, or cannot keep the medicine down, please go to the emergency room. Follow-up with your regular doctor or here in 4-6 weeks for a repeat chest x-ray. This is very important given your smoking history.

## 2015-11-16 DIAGNOSIS — Z720 Tobacco use: Secondary | ICD-10-CM | POA: Diagnosis present

## 2016-07-04 DIAGNOSIS — J42 Unspecified chronic bronchitis: Secondary | ICD-10-CM | POA: Diagnosis present

## 2016-08-16 DIAGNOSIS — M059 Rheumatoid arthritis with rheumatoid factor, unspecified: Secondary | ICD-10-CM | POA: Diagnosis present

## 2017-12-04 DIAGNOSIS — I493 Ventricular premature depolarization: Secondary | ICD-10-CM | POA: Diagnosis present

## 2017-12-15 ENCOUNTER — Other Ambulatory Visit: Payer: Self-pay | Admitting: Physician Assistant

## 2017-12-15 ENCOUNTER — Encounter: Payer: Self-pay | Admitting: Physician Assistant

## 2017-12-15 DIAGNOSIS — R195 Other fecal abnormalities: Secondary | ICD-10-CM

## 2019-12-23 ENCOUNTER — Ambulatory Visit: Payer: Medicare Other | Admitting: Podiatry

## 2019-12-23 ENCOUNTER — Ambulatory Visit: Payer: Medicare Other

## 2019-12-23 ENCOUNTER — Other Ambulatory Visit: Payer: Self-pay

## 2019-12-23 ENCOUNTER — Encounter: Payer: Self-pay | Admitting: Podiatry

## 2019-12-23 VITALS — Temp 97.4°F

## 2019-12-23 DIAGNOSIS — M79672 Pain in left foot: Secondary | ICD-10-CM

## 2019-12-23 DIAGNOSIS — Q828 Other specified congenital malformations of skin: Secondary | ICD-10-CM

## 2019-12-23 DIAGNOSIS — M79675 Pain in left toe(s): Secondary | ICD-10-CM | POA: Diagnosis not present

## 2019-12-23 DIAGNOSIS — M79671 Pain in right foot: Secondary | ICD-10-CM

## 2019-12-23 DIAGNOSIS — M79674 Pain in right toe(s): Secondary | ICD-10-CM | POA: Diagnosis not present

## 2019-12-23 DIAGNOSIS — M216X9 Other acquired deformities of unspecified foot: Secondary | ICD-10-CM

## 2019-12-23 DIAGNOSIS — B351 Tinea unguium: Secondary | ICD-10-CM | POA: Diagnosis not present

## 2019-12-24 ENCOUNTER — Telehealth: Payer: Self-pay | Admitting: Podiatry

## 2019-12-24 ENCOUNTER — Telehealth: Payer: Self-pay | Admitting: *Deleted

## 2019-12-24 MED ORDER — MUPIROCIN 2 % EX OINT: TOPICAL_OINTMENT | Freq: Two times a day (BID) | CUTANEOUS | Status: DC

## 2019-12-24 MED ORDER — MUPIROCIN CALCIUM 2 % EX CREA: 1.0000 "application " | TOPICAL_CREAM | Freq: Two times a day (BID) | CUTANEOUS | 0 refills | Status: DC

## 2019-12-24 NOTE — Telephone Encounter (Signed)
Called and left voicemail for patient to schedule follow up appt on 01/24/20 with Dr. Ardelle Anton per message from [place].

## 2019-12-24 NOTE — Telephone Encounter (Signed)
Patient called today and stated that Dr Ardelle Anton was going to give an antibiotic cream or soft cream for the right foot which is hurting when standing and there is not any redness to it and there is little swelling, does burn some and Dr Ardelle Anton sent over mupirocin ointment and I stated that Dr Ardelle Anton wanted the patient to use a moisturizer instead of the reverderm until patient comes back in about two weeks for a follow up appointment and relayed the message to patient. Misty Stanley

## 2020-01-02 NOTE — Progress Notes (Signed)
Subjective:   Patient ID: Sandra Nielsen, female   DOB: 69 y.o.   MRN: [id number]   HPI 69 year old female presents the office today for concerns of bilateral foot pain.  She states that she is a callus that is very painful to walk.  She states that usually gets them debrided but she does not manipulate she used to after retiring.  The right side is worse than the left metatarsal pain.  She did previously have some drainage from the area to the right side.  No redness or swelling. She has old orthotics that she is interested in having recovered.  Currently denies any fevers, chills, nausea, vomiting.  No calf pain, chest pain, shortness of breath.  Review of Systems  All other systems reviewed and are negative.       Objective:  Physical Exam  General: AAO x3, NAD  Dermatological: Hyperkeratotic lesion left foot submetatarsal x3 and on the right foot submetatarsal 4.  Plan to remove the right lesion smaller bleeding occurred in office today to be more verruca.  There is localized edema in this area but not able to elicit any drainage or pus or any fluctuation or crepitation.  There is no malodor.  No other open lesions.  Nails are significantly hypertrophic, dystrophic with yellow-brown discoloration.  No edema, erythema or signs of infection of the toenail sites.  Vascular: Dorsalis Pedis artery and Posterior Tibial artery pedal pulses are 2/4 bilateral with immedate capillary fill time. There is no pain with calf compression, swelling, warmth, erythema.   Neruologic: Prominence the metatarsal heads plantarly with atrophy of the fat pad.  Musculoskeletal:  Muscular strength 5/5 in all groups tested bilateral.  Gait: Unassisted, Nonantalgic.       Assessment:    69 year old female with symptomatic hyperkeratotic lesions; symptomatic onychomycosis     Plan:  -Treatment options discussed including all alternatives, risks, and complications -Etiology of symptoms were discussed -He  was treated x2 without any complications or bleeding - hyperkeratotic lesions x4.  On the right side small bleeding occurred.  The previously was more verruca but given some localized edema and the discomfort she is having to hold off on any active treatment today.  Recommend antibiotic ointment dressing changes.  Monitoring signs or symptoms of infection.  I will follow back up with her when she picks up the inserts that were sent off to have recovered and at that time if we need to consider Cantharone.  She can also consider urea cream for the other calluses.  The calluses were quite thick today.   Vivi Barrack DPM

## 2020-01-24 ENCOUNTER — Other Ambulatory Visit: Payer: Medicare Other | Admitting: Orthotics

## 2020-01-24 ENCOUNTER — Ambulatory Visit: Payer: Medicare Other | Admitting: Podiatry

## 2020-02-14 ENCOUNTER — Ambulatory Visit (INDEPENDENT_AMBULATORY_CARE_PROVIDER_SITE_OTHER): Payer: Medicare Other

## 2020-02-14 ENCOUNTER — Other Ambulatory Visit: Payer: Self-pay

## 2020-02-14 ENCOUNTER — Encounter: Payer: Self-pay | Admitting: Podiatry

## 2020-02-14 ENCOUNTER — Ambulatory Visit: Payer: Medicare Other | Admitting: Podiatry

## 2020-02-14 ENCOUNTER — Ambulatory Visit: Payer: Medicare Other | Admitting: Orthotics

## 2020-02-14 DIAGNOSIS — M79671 Pain in right foot: Secondary | ICD-10-CM

## 2020-02-14 DIAGNOSIS — Q828 Other specified congenital malformations of skin: Secondary | ICD-10-CM | POA: Diagnosis not present

## 2020-02-14 DIAGNOSIS — M216X9 Other acquired deformities of unspecified foot: Secondary | ICD-10-CM

## 2020-02-14 DIAGNOSIS — L989 Disorder of the skin and subcutaneous tissue, unspecified: Secondary | ICD-10-CM | POA: Diagnosis not present

## 2020-02-14 NOTE — Progress Notes (Signed)
Patient came in today to pick up custom made foot orthotics.  The goals were accomplished and the patient reported no dissatisfaction with said orthotics.  Patient was advised of breakin period and how to report any issues. 

## 2020-02-15 ENCOUNTER — Other Ambulatory Visit: Payer: Medicare Other | Admitting: Orthotics

## 2020-02-16 ENCOUNTER — Other Ambulatory Visit: Payer: Self-pay | Admitting: Podiatry

## 2020-02-16 ENCOUNTER — Ambulatory Visit: Payer: Medicare Other | Admitting: Podiatry

## 2020-02-16 ENCOUNTER — Other Ambulatory Visit: Payer: Self-pay

## 2020-02-16 ENCOUNTER — Encounter: Payer: Self-pay | Admitting: Podiatry

## 2020-02-16 DIAGNOSIS — L989 Disorder of the skin and subcutaneous tissue, unspecified: Secondary | ICD-10-CM

## 2020-02-16 DIAGNOSIS — M79671 Pain in right foot: Secondary | ICD-10-CM

## 2020-02-16 NOTE — Progress Notes (Signed)
Subjective: 69 year old female presents the office today for follow-up evaluation of a painful skin lesion on the ball of her right foot.  She states that she did notice that there is previously some infection as it did drain and become swollen but this has resolved.  The area is just tender to touch still which is ongoing when I saw her originally.  She is also presenting today to see Raiford Noble for orthotics. Denies any systemic complaints such as fevers, chills, nausea, vomiting. No acute changes since last appointment, and no other complaints at this time.   Objective: AAO x3, NAD DP/PT pulses palpable bilaterally, CRT less than 3 seconds Right foot submetatarsal 4 is a painful hyperkeratotic lesion.  Upon debridement not able to identify any drainage or possibly foreign body.  There is no surrounding erythema, ascending cellulitis there is no fluctuation crepitation.  There is no malodor. No open lesions or pre-ulcerative lesions.  No pain with calf compression, swelling, warmth, erythema  Assessment: Painful skin lesion right foot  Plan: -All treatment options discussed with the patient including all alternatives, risks, complications.  -Debrided the hyperkeratotic tissue to the any complications or bleeding.  Given that pain to the area I recommended excision, biopsy.  Does clinically appear to be a wart.  We will plan on doing this on Wednesday for excision.  She understands.  We discussed the procedure as well as postoperative course. -Patient encouraged to call the office with any questions, concerns, change in symptoms.   Vivi Barrack DPM

## 2020-02-18 LAB — PATHOLOGY REPORT

## 2020-02-18 LAB — TISSUE SPECIMEN

## 2020-02-24 NOTE — Progress Notes (Signed)
SomeSubjective: 69 year old female presents the office today for biopsy, excision of skin lesion on the right foot.  She has had a painful skin lesion to this area for quite some time.  This does appear to be different than the other calluses that she has.  She denies any recent injury or stepping on any foreign objects.  She has had some drainage she reports previously but not at this time. Denies any systemic complaints such as fevers, chills, nausea, vomiting. No acute changes since last appointment, and no other complaints at this time.   Objective: AAO x3, NAD DP/PT pulses palpable bilaterally, CRT less than 3 seconds On the right foot submetatarsal is hyperkeratotic tissue with slight bleeding upon debridement.  There is tenderness palpation.  There is no surrounding erythema, ascending cellulitis. No open lesions or pre-ulcerative lesions.  No pain with calf compression, swelling, warmth, erythema  Assessment: Skin lesion right foot-presents for excision, biopsy  Plan: -All treatment options discussed with the patient including all alternatives, risks, complications.  -I discussed with her the procedure as well as postoperative course.  Discussed alternatives, risks, complications.  Anytime she wishes to proceed.  Consent was signed.  1 cc of lidocaine with epinephrine was infiltrated to the skin lesion.  Once anesthetized the skin was prepped with Betadine.  A 4 mm punch biopsy was utilized to excise the lesion.  This was passed and sent for pathology.  There is irrigated.  A single suture was placed.  Antibiotic ointment and a bandage applied.  Post procedure instructions discussed.  Monitoring signs or symptoms of infection. -Patient encouraged to call the office with any questions, concerns, change in symptoms.   RTC 1 week or sooner if needed  Vivi Barrack DPM

## 2020-03-06 ENCOUNTER — Encounter: Payer: Self-pay | Admitting: Podiatry

## 2020-03-06 ENCOUNTER — Ambulatory Visit: Payer: Medicare Other | Admitting: Podiatry

## 2020-03-06 ENCOUNTER — Other Ambulatory Visit: Payer: Self-pay

## 2020-03-06 DIAGNOSIS — M79671 Pain in right foot: Secondary | ICD-10-CM | POA: Diagnosis not present

## 2020-03-06 DIAGNOSIS — L989 Disorder of the skin and subcutaneous tissue, unspecified: Secondary | ICD-10-CM | POA: Diagnosis not present

## 2020-03-07 NOTE — Progress Notes (Signed)
Subjective: 69 year old female presents the office for follow-up evaluation of soft tissue lesion excision, biopsy of the right foot.  She states that overall she is feeling well with some occasional discomfort.  She states it feels different than to prior to the procedure.  Denies any drainage or pus pain swelling or redness.  I called her to discuss the biopsy results previously had been trying to contact dermatologist that she is going to be seeing for in July to discuss treatments. Denies any systemic complaints such as fevers, chills, nausea, vomiting. No acute changes since last appointment, and no other complaints at this time.   Objective: AAO x3, NAD DP/PT pulses palpable bilaterally, CRT less than 3 seconds Status post biopsy site right foot which is superficial.  Hyperkeratotic tissue.  The wound is superficial.  Normally clear.  There is no pus.  There is no surrounding edema, erythema or signs of infection. No pain with calf compression, swelling, warmth, erythema  Assessment: Skin lesion right foot; atypical squamous proliferation  Plan: -All treatment options discussed with the patient including all alternatives, risks, complications.  -I discussed the biopsy results with her and possible reexcision but at the contact her dermatologist to see if they can treat this.  I tried contacting them while in the office with the patient however not able to reach them.  I will continue to try. -Patient encouraged to call the office with any questions, concerns, change in symptoms.   Vivi Barrack DPM

## 2020-03-10 ENCOUNTER — Telehealth: Payer: Self-pay | Admitting: *Deleted

## 2020-03-10 NOTE — Telephone Encounter (Signed)
-----   Message from Vivi Barrack, DPM sent at 03/07/2020  5:37 PM EDT ----- Misty Stanley or Val- I have discussed the results with the patient. I tried to call the below PA to see if they will treat potential squamous cell carcinoma. I tried 2 times and they called back yesterday while I was in clinic and then not able to reach them. Can someone call to see if they will see her for this and possibly for one on the left side. She has an appointment already scheduled to see them in July but if they are not able to treat this I would like to refer her somewhere else for the feet. Thank you  Gala Murdoch, PA-C with [facility].

## 2020-03-10 NOTE — Telephone Encounter (Signed)
Called and spoke with the answering service and they stated that the patient has an appointment on Tuesday June 1st with Dr Patricia Nettle at 9:30 am for the foot and the phone number is [phone]. Sandra Nielsen

## 2020-03-22 ENCOUNTER — Telehealth: Payer: Self-pay | Admitting: *Deleted

## 2020-03-22 NOTE — Telephone Encounter (Signed)
Called and spoke with the patient yesterday and asked how she was doing and the dermatology appointment went well and I stated to call the office if any concerns or questions. Sandra Nielsen

## 2020-03-27 ENCOUNTER — Ambulatory Visit: Payer: Medicare Other | Admitting: Podiatry

## 2021-02-28 DIAGNOSIS — E785 Hyperlipidemia, unspecified: Secondary | ICD-10-CM | POA: Diagnosis present

## 2021-04-18 DIAGNOSIS — S32509A Unspecified fracture of unspecified pubis, initial encounter for closed fracture: Secondary | ICD-10-CM | POA: Diagnosis present

## 2021-05-05 ENCOUNTER — Inpatient Hospital Stay (HOSPITAL_COMMUNITY)
Admission: EM | Admit: 2021-05-05 | Discharge: 2021-05-09 | DRG: 065 | Disposition: A | Discharge status: Home Health | Payer: Medicare Other | Attending: Internal Medicine | Admitting: Internal Medicine

## 2021-05-05 ENCOUNTER — Observation Stay (HOSPITAL_COMMUNITY): Payer: Medicare Other

## 2021-05-05 ENCOUNTER — Emergency Department (HOSPITAL_COMMUNITY): Payer: Medicare Other

## 2021-05-05 ENCOUNTER — Other Ambulatory Visit: Payer: Self-pay

## 2021-05-05 DIAGNOSIS — Z79899 Other long term (current) drug therapy: Secondary | ICD-10-CM

## 2021-05-05 DIAGNOSIS — Z20822 Contact with and (suspected) exposure to covid-19: Secondary | ICD-10-CM | POA: Diagnosis present

## 2021-05-05 DIAGNOSIS — F17210 Nicotine dependence, cigarettes, uncomplicated: Secondary | ICD-10-CM | POA: Diagnosis present

## 2021-05-05 DIAGNOSIS — M059 Rheumatoid arthritis with rheumatoid factor, unspecified: Secondary | ICD-10-CM | POA: Diagnosis present

## 2021-05-05 DIAGNOSIS — R1310 Dysphagia, unspecified: Secondary | ICD-10-CM | POA: Insufficient documentation

## 2021-05-05 DIAGNOSIS — R29705 NIHSS score 5: Secondary | ICD-10-CM | POA: Diagnosis present

## 2021-05-05 DIAGNOSIS — G8194 Hemiplegia, unspecified affecting left nondominant side: Secondary | ICD-10-CM | POA: Diagnosis present

## 2021-05-05 DIAGNOSIS — E785 Hyperlipidemia, unspecified: Secondary | ICD-10-CM | POA: Diagnosis not present

## 2021-05-05 DIAGNOSIS — Z853 Personal history of malignant neoplasm of breast: Secondary | ICD-10-CM

## 2021-05-05 DIAGNOSIS — R531 Weakness: Secondary | ICD-10-CM

## 2021-05-05 DIAGNOSIS — I10 Essential (primary) hypertension: Secondary | ICD-10-CM | POA: Diagnosis present

## 2021-05-05 DIAGNOSIS — I639 Cerebral infarction, unspecified: Secondary | ICD-10-CM | POA: Diagnosis not present

## 2021-05-05 DIAGNOSIS — R29810 Facial weakness: Secondary | ICD-10-CM | POA: Diagnosis present

## 2021-05-05 DIAGNOSIS — M542 Cervicalgia: Secondary | ICD-10-CM | POA: Diagnosis present

## 2021-05-05 DIAGNOSIS — I471 Supraventricular tachycardia: Secondary | ICD-10-CM | POA: Diagnosis not present

## 2021-05-05 DIAGNOSIS — I4891 Unspecified atrial fibrillation: Secondary | ICD-10-CM | POA: Diagnosis present

## 2021-05-05 DIAGNOSIS — J449 Chronic obstructive pulmonary disease, unspecified: Secondary | ICD-10-CM | POA: Diagnosis present

## 2021-05-05 DIAGNOSIS — J42 Unspecified chronic bronchitis: Secondary | ICD-10-CM | POA: Diagnosis present

## 2021-05-05 DIAGNOSIS — B372 Candidiasis of skin and nail: Secondary | ICD-10-CM | POA: Diagnosis present

## 2021-05-05 DIAGNOSIS — Z8616 Personal history of COVID-19: Secondary | ICD-10-CM

## 2021-05-05 DIAGNOSIS — Z7952 Long term (current) use of systemic steroids: Secondary | ICD-10-CM

## 2021-05-05 DIAGNOSIS — Z880 Allergy status to penicillin: Secondary | ICD-10-CM

## 2021-05-05 DIAGNOSIS — K219 Gastro-esophageal reflux disease without esophagitis: Secondary | ICD-10-CM | POA: Diagnosis present

## 2021-05-05 DIAGNOSIS — I493 Ventricular premature depolarization: Secondary | ICD-10-CM | POA: Diagnosis present

## 2021-05-05 DIAGNOSIS — I63411 Cerebral infarction due to embolism of right middle cerebral artery: Secondary | ICD-10-CM

## 2021-05-05 DIAGNOSIS — Z720 Tobacco use: Secondary | ICD-10-CM | POA: Diagnosis present

## 2021-05-05 DIAGNOSIS — Z888 Allergy status to other drugs, medicaments and biological substances status: Secondary | ICD-10-CM

## 2021-05-05 DIAGNOSIS — R471 Dysarthria and anarthria: Principal | ICD-10-CM

## 2021-05-05 DIAGNOSIS — S32509A Unspecified fracture of unspecified pubis, initial encounter for closed fracture: Secondary | ICD-10-CM | POA: Diagnosis present

## 2021-05-05 LAB — DIFFERENTIAL
Abs Immature Granulocytes: 0.03 10*3/uL (ref 0.00–0.07)
Basophils Absolute: 0.1 10*3/uL (ref 0.0–0.1)
Basophils Relative: 1 %
Eosinophils Absolute: 0 10*3/uL (ref 0.0–0.5)
Eosinophils Relative: 0 %
Immature Granulocytes: 0 %
Lymphocytes Relative: 9 %
Lymphs Abs: 0.9 10*3/uL (ref 0.7–4.0)
Monocytes Absolute: 0.8 10*3/uL (ref 0.1–1.0)
Monocytes Relative: 8 %
Neutro Abs: 8.1 10*3/uL — ABNORMAL HIGH (ref 1.7–7.7)
Neutrophils Relative %: 82 %

## 2021-05-05 LAB — COMPREHENSIVE METABOLIC PANEL
ALT: 19 U/L (ref 0–44)
AST: 23 U/L (ref 15–41)
Albumin: 3.3 g/dL — ABNORMAL LOW (ref 3.5–5.0)
Alkaline Phosphatase: 132 U/L — ABNORMAL HIGH (ref 38–126)
Anion gap: 11 (ref 5–15)
BUN: 10 mg/dL (ref 8–23)
CO2: 26 mmol/L (ref 22–32)
Calcium: 9.7 mg/dL (ref 8.9–10.3)
Chloride: 98 mmol/L (ref 98–111)
Creatinine, Ser: 0.9 mg/dL (ref 0.44–1.00)
GFR, Estimated: >60 mL/min (ref >60)
Glucose, Bld: 172 mg/dL — ABNORMAL HIGH (ref 70–99)
Potassium: 3.6 mmol/L (ref 3.5–5.1)
Sodium: 135 mmol/L (ref 135–145)
Total Bilirubin: 0.7 mg/dL (ref 0.3–1.2)
Total Protein: 6.5 g/dL (ref 6.5–8.1)

## 2021-05-05 LAB — I-STAT CHEM 8, ED
BUN: 11 mg/dL (ref 8–23)
Calcium, Ion: 1.08 mmol/L — ABNORMAL LOW (ref 1.15–1.40)
Chloride: 98 mmol/L (ref 98–111)
Creatinine, Ser: 0.7 mg/dL (ref 0.44–1.00)
Glucose, Bld: 168 mg/dL — ABNORMAL HIGH (ref 70–99)
HCT: 39 % (ref 36.0–46.0)
Hemoglobin: 13.3 g/dL (ref 12.0–15.0)
Potassium: 3.9 mmol/L (ref 3.5–5.1)
Sodium: 135 mmol/L (ref 135–145)
TCO2: 29 mmol/L (ref 22–32)

## 2021-05-05 LAB — CBC
HCT: 38 % (ref 36.0–46.0)
Hemoglobin: 12.6 g/dL (ref 12.0–15.0)
MCH: 36.1 pg — ABNORMAL HIGH (ref 26.0–34.0)
MCHC: 33.2 g/dL (ref 30.0–36.0)
MCV: 108.9 fL — ABNORMAL HIGH (ref 80.0–100.0)
Platelets: 293 10*3/uL (ref 150–400)
RBC: 3.49 MIL/uL — ABNORMAL LOW (ref 3.87–5.11)
RDW: 16 % — ABNORMAL HIGH (ref 11.5–15.5)
WBC: 9.9 10*3/uL (ref 4.0–10.5)
nRBC: 0 % (ref 0.0–0.2)

## 2021-05-05 LAB — PROTIME-INR
INR: 1 (ref 0.8–1.2)
Prothrombin Time: 13.4 seconds (ref 11.4–15.2)

## 2021-05-05 LAB — HIV ANTIBODY (ROUTINE TESTING W REFLEX): HIV Screen 4th Generation wRfx: NONREACTIVE

## 2021-05-05 LAB — SARS CORONAVIRUS 2 (TAT 6-24 HRS): SARS Coronavirus 2: NEGATIVE

## 2021-05-05 LAB — CBG MONITORING, ED: Glucose-Capillary: 205 mg/dL — ABNORMAL HIGH (ref 70–99)

## 2021-05-05 LAB — APTT: aPTT: 25 seconds (ref 24–36)

## 2021-05-05 LAB — LDL CHOLESTEROL, DIRECT: Direct LDL: 57.3 mg/dL (ref 0–99)

## 2021-05-05 IMAGING — MR MR MRA HEAD W/O CM
2 series · 18 of 48 positions shown · IV contrast (gadavist)
Comparison: Head CT [DATE]

CLINICAL DATA: Neuro deficit, acute, stroke suspected. Slurred
speech.

EXAM:
MRI HEAD WITHOUT CONTRAST
MRA HEAD WITHOUT CONTRAST
MRA OF THE NECK WITHOUT AND WITH CONTRAST
TECHNIQUE: Multiplanar, multi-echo pulse sequences of the brain and surrounding
structures were acquired without intravenous contrast. Angiographic
images of the Circle of Willis were acquired using MRA technique
without intravenous contrast. Angiographic images of the neck were
acquired using MRA technique without and with intravenous contrast.
Carotid stenosis measurements (when applicable) are obtained
utilizing NASCET criteria, using the distal internal carotid
diameter as the denominator.
CONTRAST:  7.1mL GADAVIST GADOBUTROL 1 MMOL/ML IV SOLN

[Series 2: ax (id) · axial · 1.0mm · 0.43mm/px · z∈[-67,+11]mm · 17 of 176 slices shown]
[im 1/176]
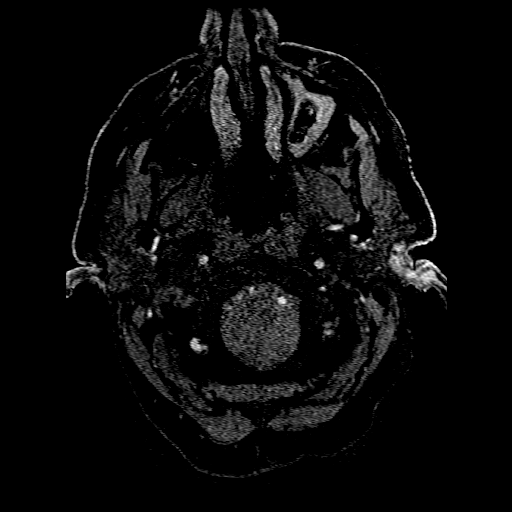
[im 4/176]
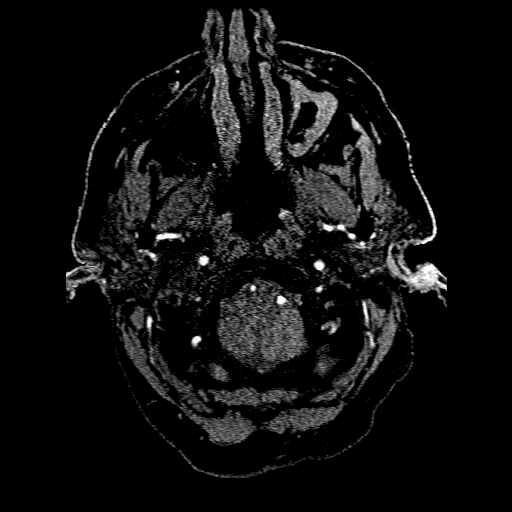
[im 8/176]
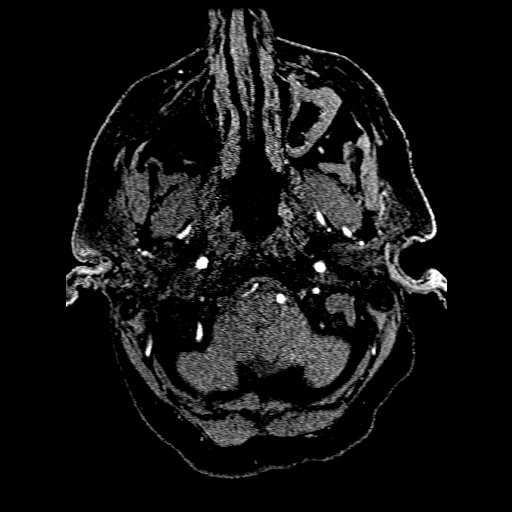
[im 12/176]
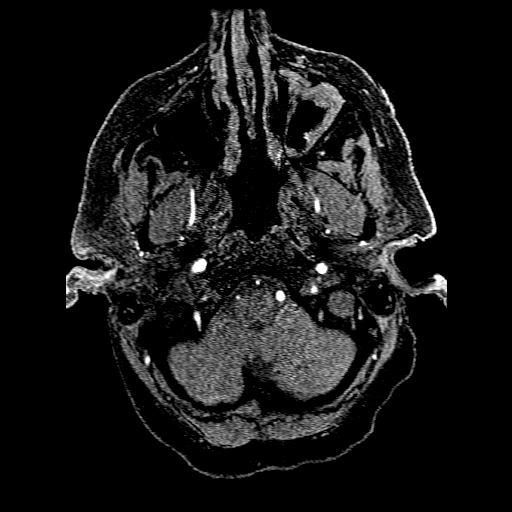
[im 16/176]
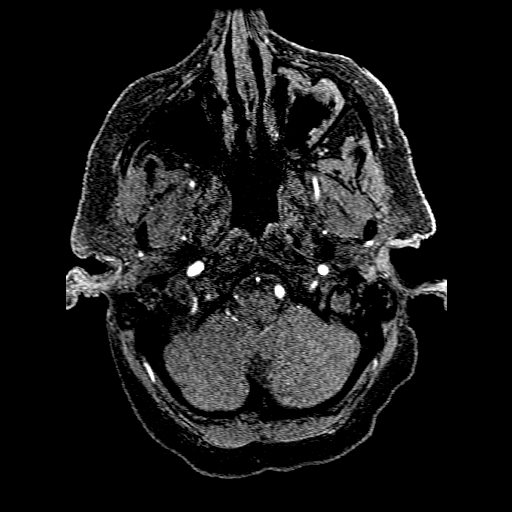
[im 20/176]
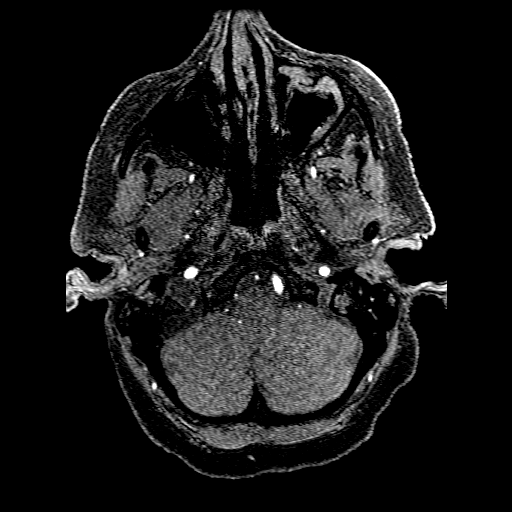
[im 23/176]
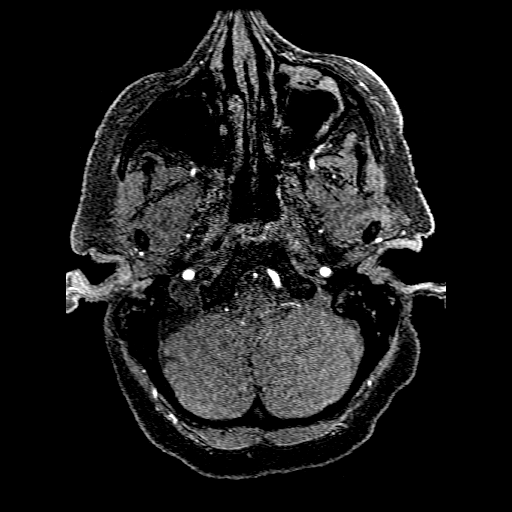
[im 27/176]
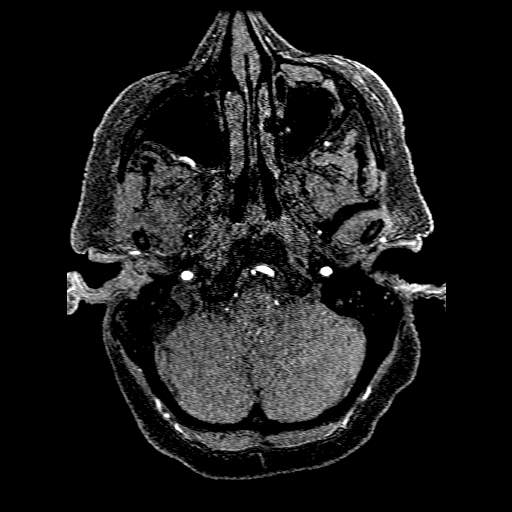
[im 31/176]
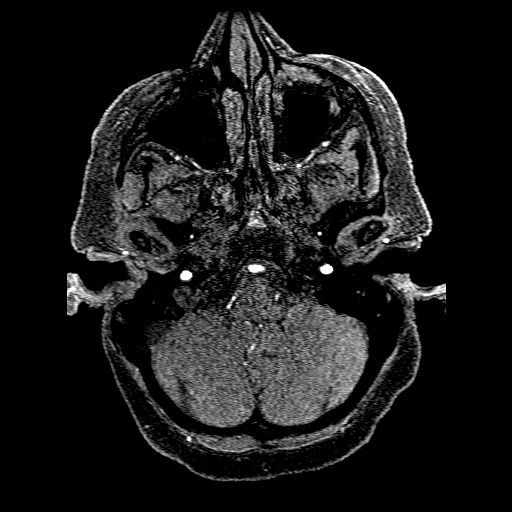
[im 54/176]
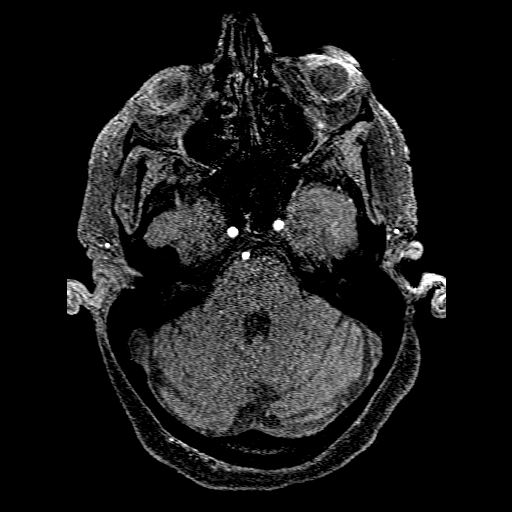
[im 77/176]
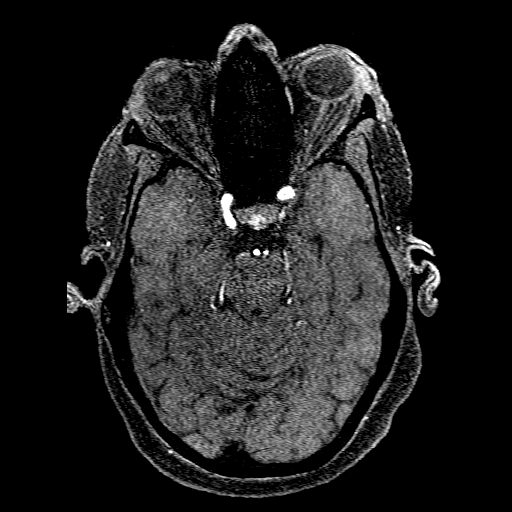
[im 88/176]
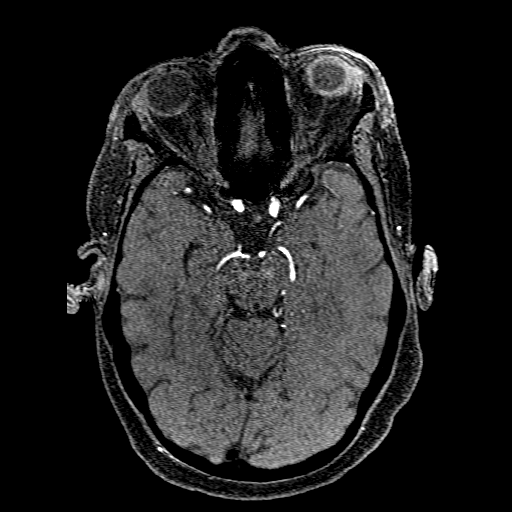
[im 99/176]
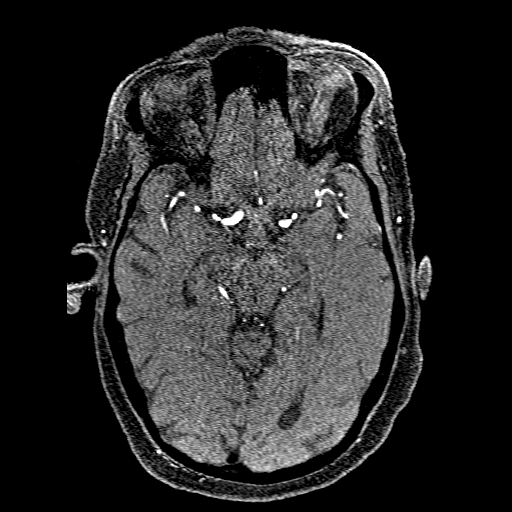
[im 122/176]
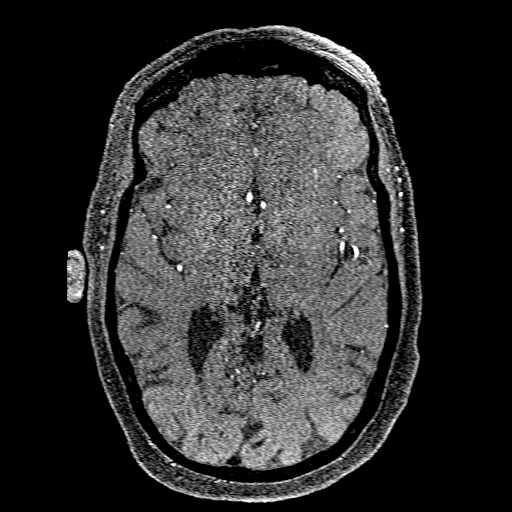
[im 145/176]
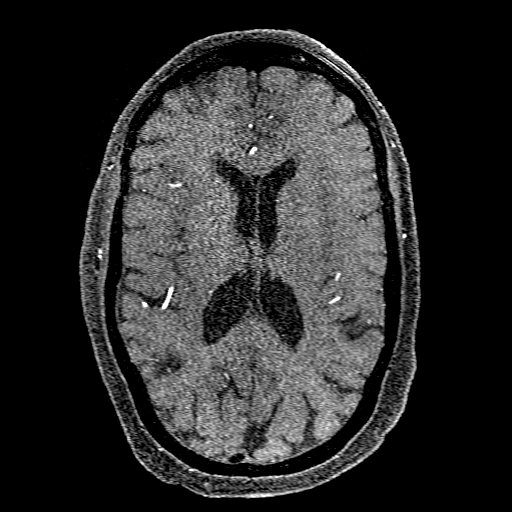
[im 149/176]
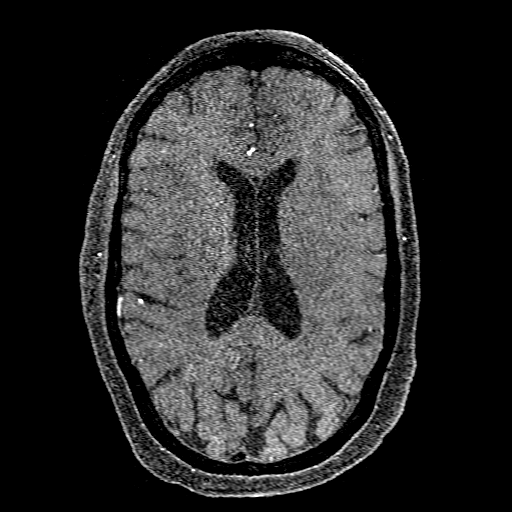
[im 168/176]
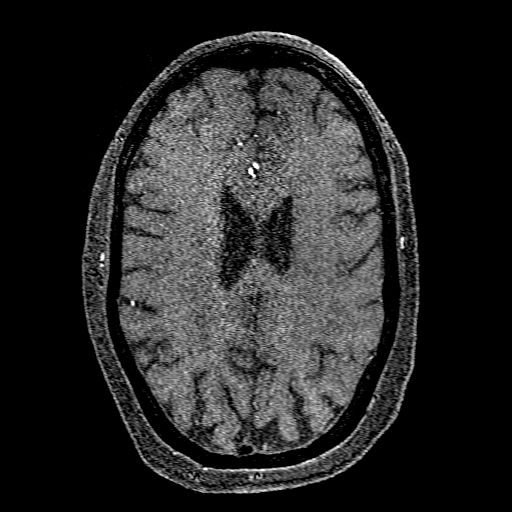

[Series 201: pjn:ax (id) · sagittal · 1.0mm · 0.43mm/px · 1 of 5 slices shown]
[im 1/5]
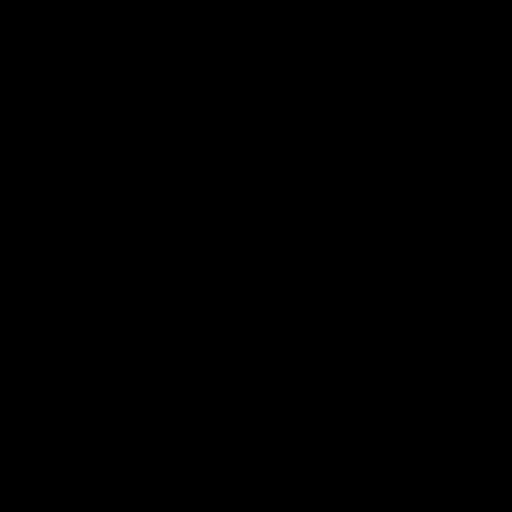

[18 of 48 positions shown; findings below may reference images not displayed]

FINDINGS: MR HEAD FINDINGS

Brain: There is a small acute infarct involving the right insula.
Patchy T2 hyperintensities in the cerebral white matter and pons are
nonspecific but compatible with moderate chronic small vessel
ischemic disease. Mild cerebral atrophy is within normal limits for
age. No intracranial hemorrhage, mass, midline shift, or extra-axial
fluid collection is identified.

Vascular: Major intracranial vascular flow voids are preserved.

Skull and upper cervical spine: Unremarkable bone marrow signal.

Sinuses/Orbits: Unremarkable orbits. Moderate mucosal thickening in
the left maxillary sinus. Small bilateral mastoid effusions.

Other: None.

MRA HEAD FINDINGS

Anterior circulation: The internal carotid arteries are widely
patent from skull base to carotid termini. The ACAs and MCAs are
patent without evidence of a proximal branch occlusion or
significant proximal stenosis. No aneurysm is identified.

Posterior circulation: The included intracranial portions of the
vertebral arteries are patent to the basilar with the left being
strongly dominant. The basilar artery is widely patent. There are
moderate-sized posterior communicating arteries bilaterally. Both
PCAs are patent without evidence of a significant proximal stenosis.
No aneurysm is identified.

Anatomic variants: Duplicated middle cerebral arteries bilaterally.
Hypoplastic right vertebral artery.

MRA NECK FINDINGS

The study is mildly motion degraded.

Aortic arch: Normal variant 4 vessel aortic arch with the left
vertebral artery arising from the arch. Widely patent arch vessel
origins.

Right carotid system: Patent without evidence of a significant
stenosis or dissection.

Left carotid system: Patent without evidence of a significant
stenosis or dissection.

Vertebral arteries: The vertebral arteries are patent with antegrade
flow bilaterally and with the left being dominant. Motion artifact
limits assessment of the vertebral artery origins, however there is
no evidence of a significant stenosis or dissection on either side
more distally.
IMPRESSION: 1. Small acute infarct involving the right insula.
2. Moderate chronic small vessel ischemic disease.
3. Negative head MRA.
4. Negative neck MRA within limitations of mild motion artifact.

## 2021-05-05 IMAGING — MR MR HEAD W/O CM
6 of 10 series · 29 of 48 positions shown · IV contrast (gadavist)
Comparison: Head CT [DATE]

CLINICAL DATA: Neuro deficit, acute, stroke suspected. Slurred
speech.

EXAM:
MRI HEAD WITHOUT CONTRAST
MRA HEAD WITHOUT CONTRAST
MRA OF THE NECK WITHOUT AND WITH CONTRAST
TECHNIQUE: Multiplanar, multi-echo pulse sequences of the brain and surrounding
structures were acquired without intravenous contrast. Angiographic
images of the Circle of Willis were acquired using MRA technique
without intravenous contrast. Angiographic images of the neck were
acquired using MRA technique without and with intravenous contrast.
Carotid stenosis measurements (when applicable) are obtained
utilizing NASCET criteria, using the distal internal carotid
diameter as the denominator.
CONTRAST:  7.1mL GADAVIST GADOBUTROL 1 MMOL/ML IV SOLN

[Series 3: DWI · axial · 3.0mm · 0.94mm/px · z∈[-91,+57]mm · 9 of 108 slices shown (1 of 2)]
[im 1/108]
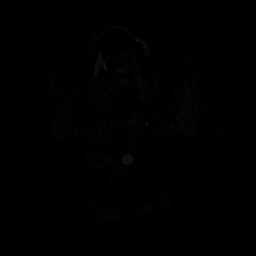
[im 14/108]
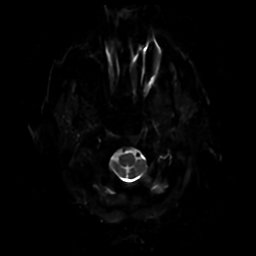
[im 27/108]
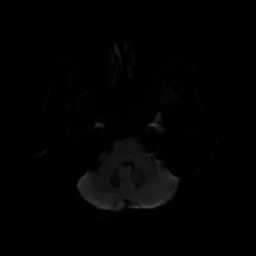
[im 41/108]
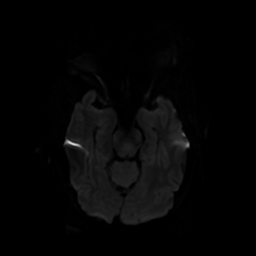
[im 54/108]
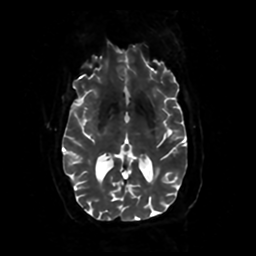
[im 67/108]
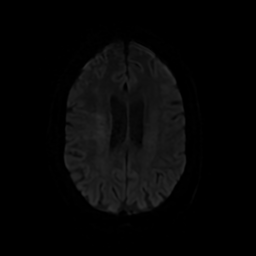
[im 81/108]
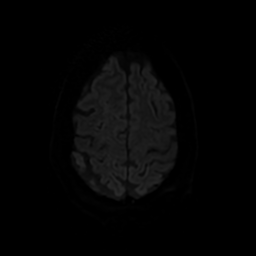
[im 94/108]
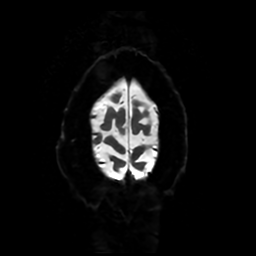
[im 108/108]
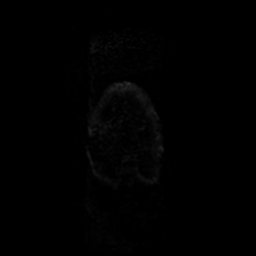

[Series 4: DWI · coronal · 4.0mm · 0.94mm/px · 7 of 78 slices shown (2 of 2)]
[im 1/78]
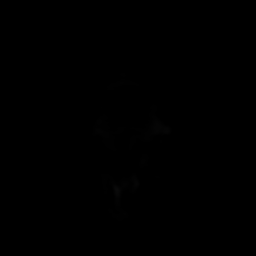
[im 13/78]
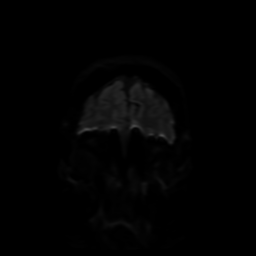
[im 26/78]
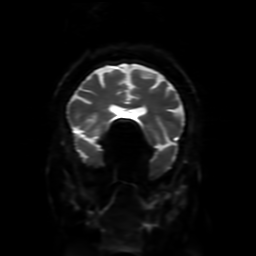
[im 39/78]
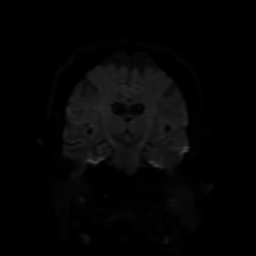
[im 52/78]
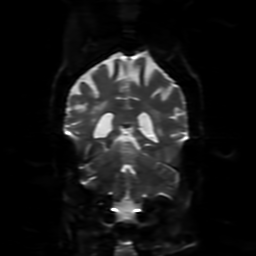
[im 65/78]
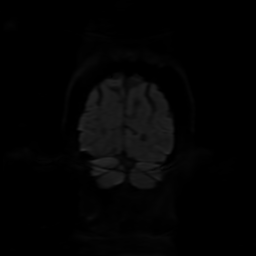
[im 78/78]
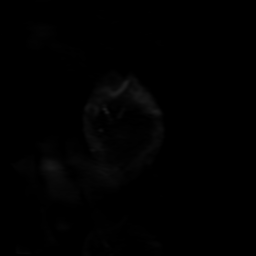

[Series 5: FLAIR · sagittal · 5.0mm · 0.23mm/px · 2 of 23 slices shown (1 of 2)]
[im 1/23]
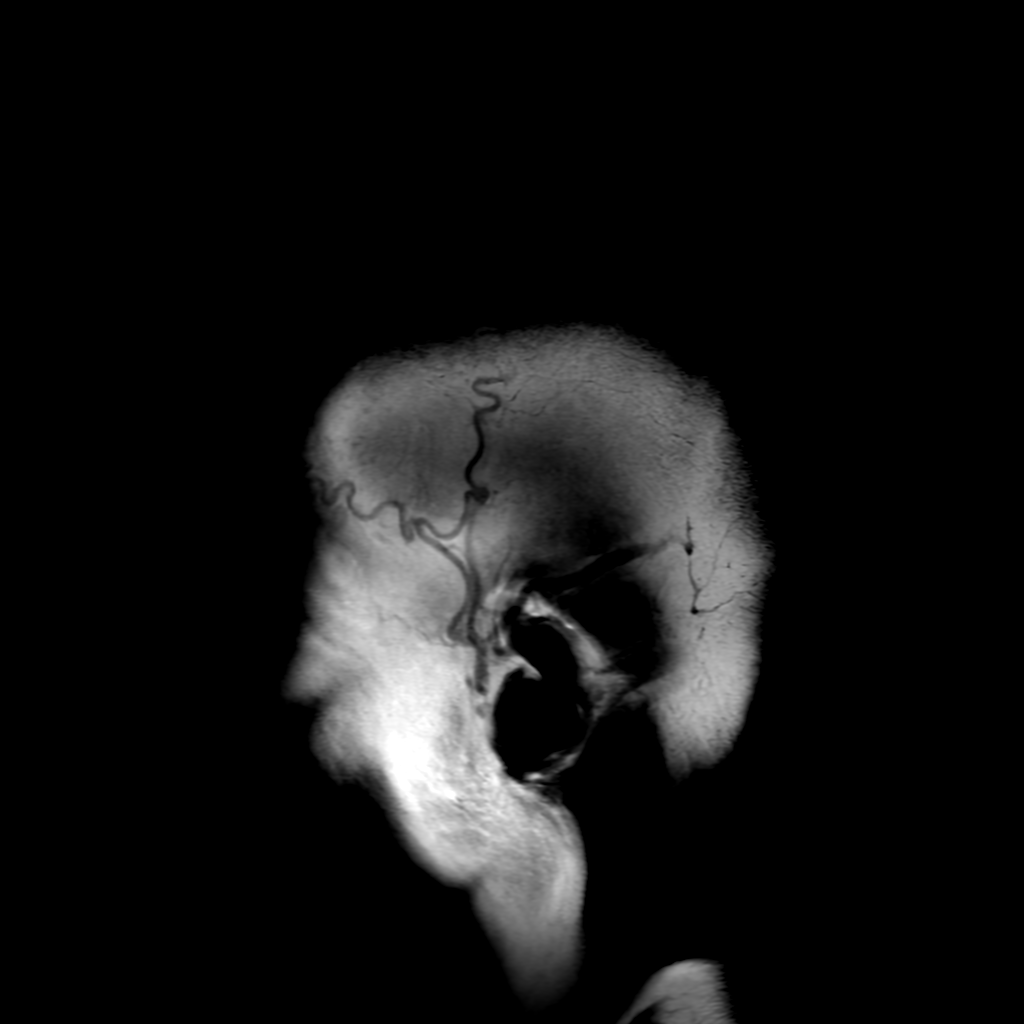
[im 23/23]
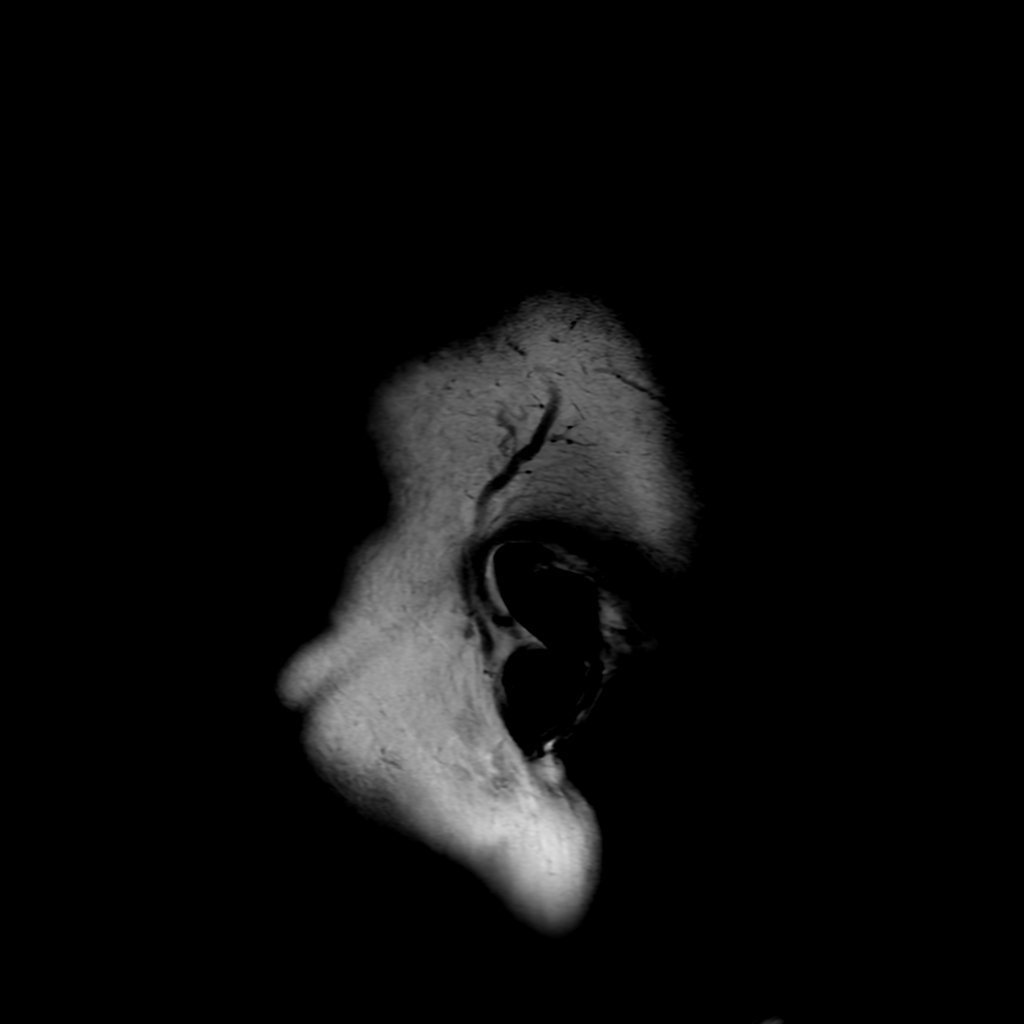

[Series 7: FLAIR · axial · 4.0mm · 0.45mm/px · z∈[-89,+58]mm · 3 of 37 slices shown (2 of 2)]
[im 1/37]
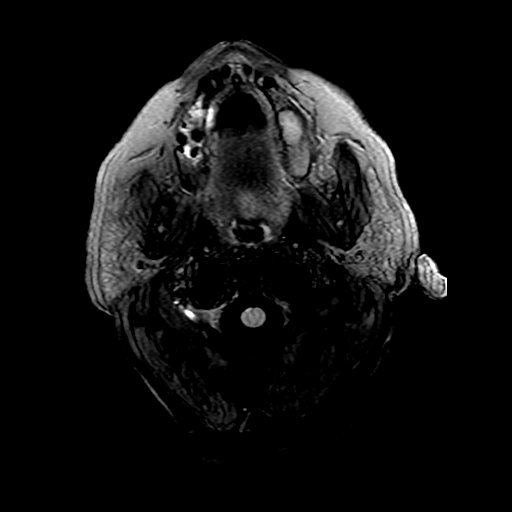
[im 19/37]
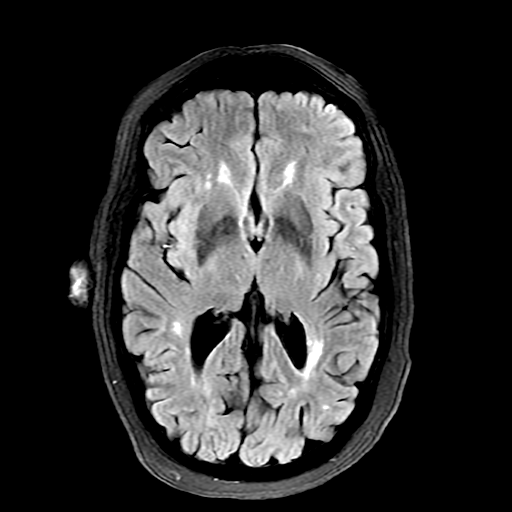
[im 37/37]
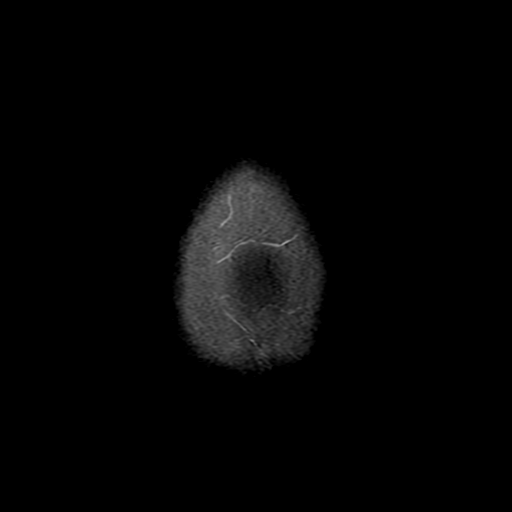

[Series 350: ADC · axial · 3.0mm · 0.94mm/px · z∈[-91,+57]mm · 5 of 53 slices shown (1 of 2)]
[im 1/53]
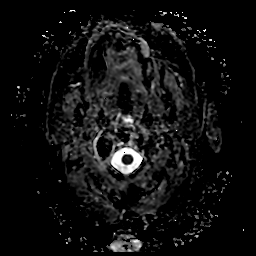
[im 14/53]
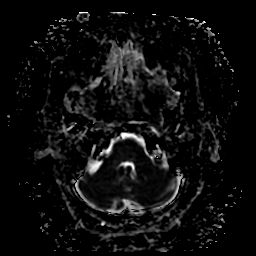
[im 27/53]
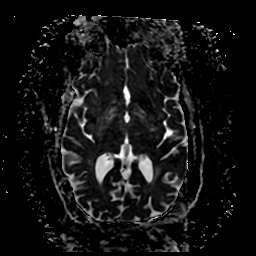
[im 40/53]
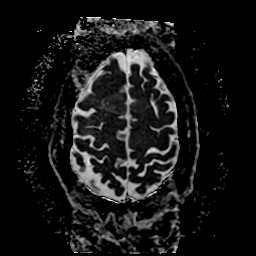
[im 53/53]
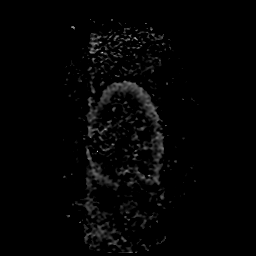

[Series 450: ADC · coronal · 4.0mm · 0.94mm/px · 3 of 38 slices shown (2 of 2)]
[im 1/38]
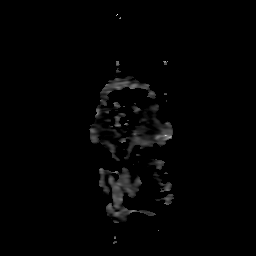
[im 19/38]
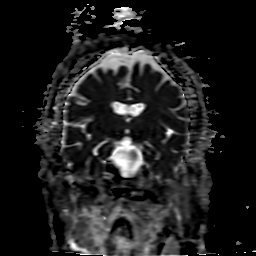
[im 38/38]
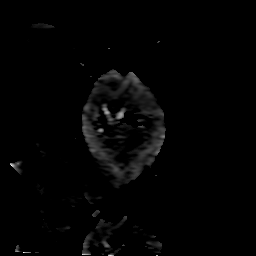

[29 of 48 positions shown; findings below may reference images not displayed]

FINDINGS: MR HEAD FINDINGS

Brain: There is a small acute infarct involving the right insula.
Patchy T2 hyperintensities in the cerebral white matter and pons are
nonspecific but compatible with moderate chronic small vessel
ischemic disease. Mild cerebral atrophy is within normal limits for
age. No intracranial hemorrhage, mass, midline shift, or extra-axial
fluid collection is identified.

Vascular: Major intracranial vascular flow voids are preserved.

Skull and upper cervical spine: Unremarkable bone marrow signal.

Sinuses/Orbits: Unremarkable orbits. Moderate mucosal thickening in
the left maxillary sinus. Small bilateral mastoid effusions.

Other: None.

MRA HEAD FINDINGS

Anterior circulation: The internal carotid arteries are widely
patent from skull base to carotid termini. The ACAs and MCAs are
patent without evidence of a proximal branch occlusion or
significant proximal stenosis. No aneurysm is identified.

Posterior circulation: The included intracranial portions of the
vertebral arteries are patent to the basilar with the left being
strongly dominant. The basilar artery is widely patent. There are
moderate-sized posterior communicating arteries bilaterally. Both
PCAs are patent without evidence of a significant proximal stenosis.
No aneurysm is identified.

Anatomic variants: Duplicated middle cerebral arteries bilaterally.
Hypoplastic right vertebral artery.

MRA NECK FINDINGS

The study is mildly motion degraded.

Aortic arch: Normal variant 4 vessel aortic arch with the left
vertebral artery arising from the arch. Widely patent arch vessel
origins.

Right carotid system: Patent without evidence of a significant
stenosis or dissection.

Left carotid system: Patent without evidence of a significant
stenosis or dissection.

Vertebral arteries: The vertebral arteries are patent with antegrade
flow bilaterally and with the left being dominant. Motion artifact
limits assessment of the vertebral artery origins, however there is
no evidence of a significant stenosis or dissection on either side
more distally.
IMPRESSION: 1. Small acute infarct involving the right insula.
2. Moderate chronic small vessel ischemic disease.
3. Negative head MRA.
4. Negative neck MRA within limitations of mild motion artifact.

## 2021-05-05 IMAGING — CT CT HEAD CODE STROKE
4 series · 16 of 47 positions shown, 18 images · non-contrast
Comparison: None.

CLINICAL DATA: Code stroke. Neuro deficit, acute, stroke suspected.
Left facial droop and slurred speech.

EXAM:
CT HEAD WITHOUT CONTRAST
TECHNIQUE: Contiguous axial images were obtained from the base of the skull
through the vertex without intravenous contrast.

[Series 2: head wo · axial · 0.46mm/px · z∈[+498,+648]mm · 7 of 40 slices shown, 9 images]
[im 5/40  brain]
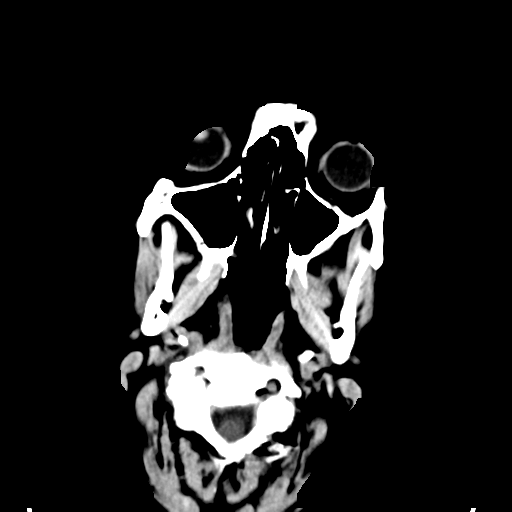
[im 5/40  bone]
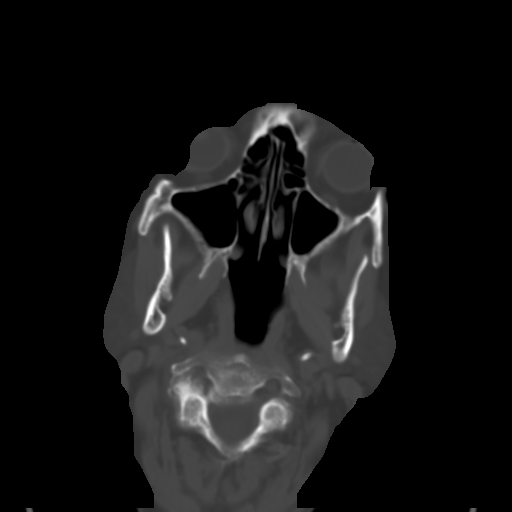
[im 10/40  brain]
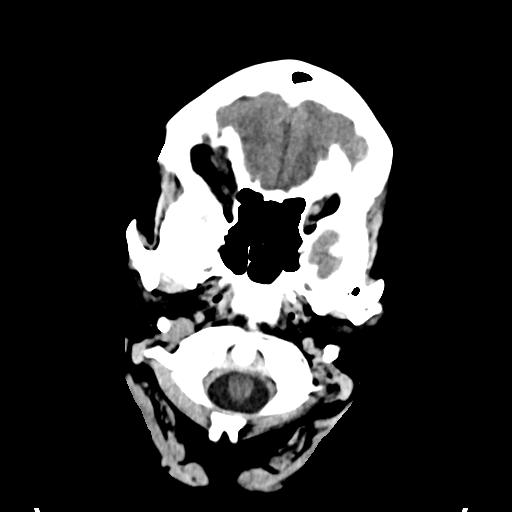
[im 15/40  brain]
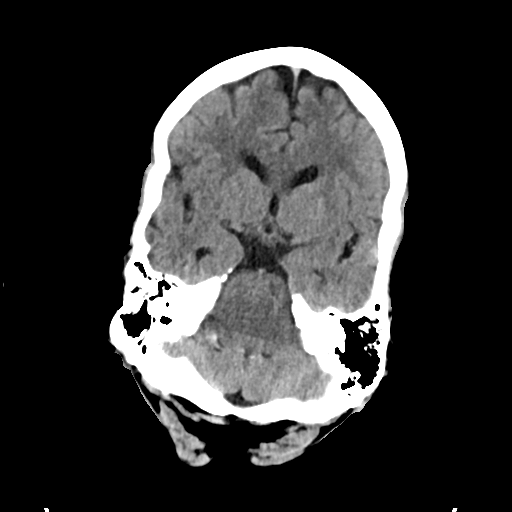
[im 20/40  brain]
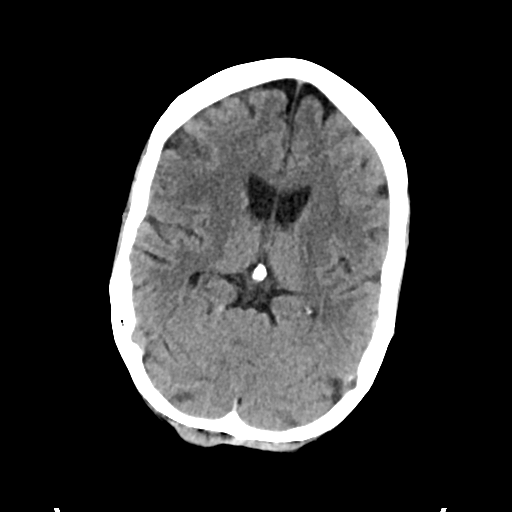
[im 25/40  brain]
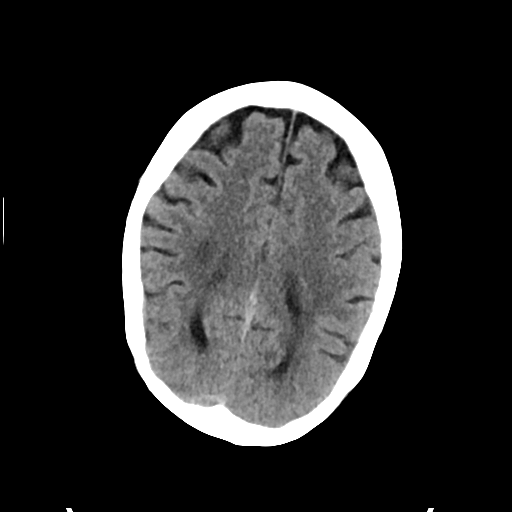
[im 25/40  bone]
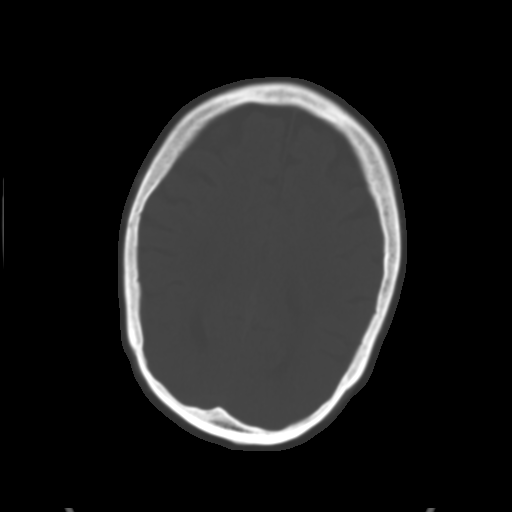
[im 30/40  brain]
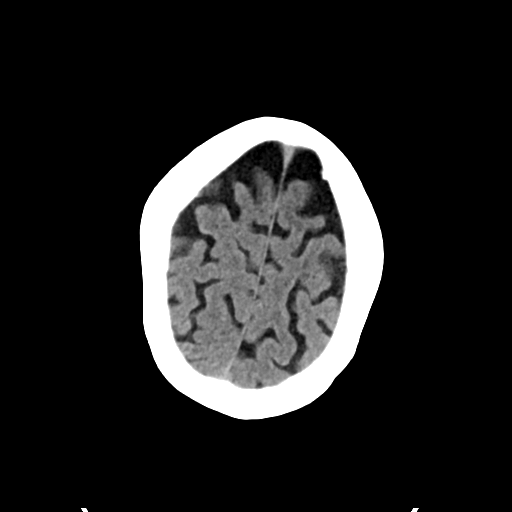
[im 35/40  brain]
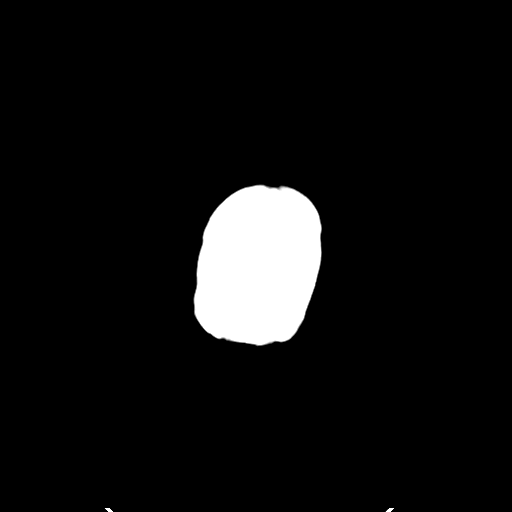

[Series 4: head bone · axial · 0.46mm/px · z∈[+496,+536]mm · 3 of 98 slices shown]
[im 10/98  bone]
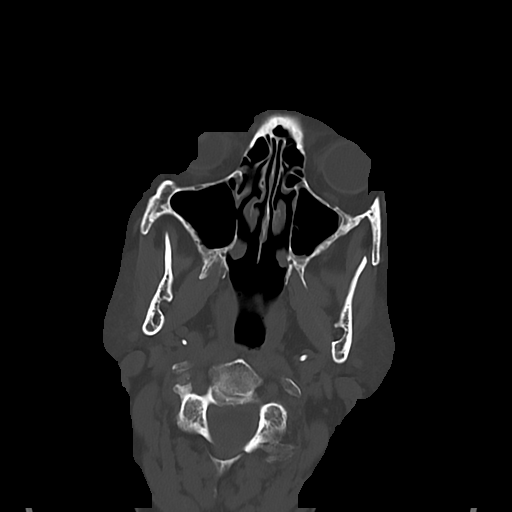
[im 20/98  bone]
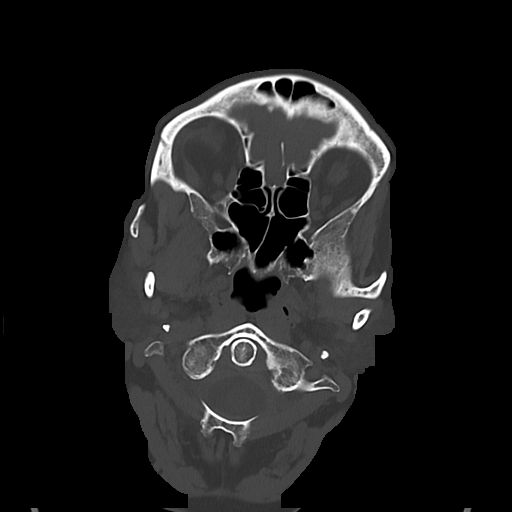
[im 30/98  bone]
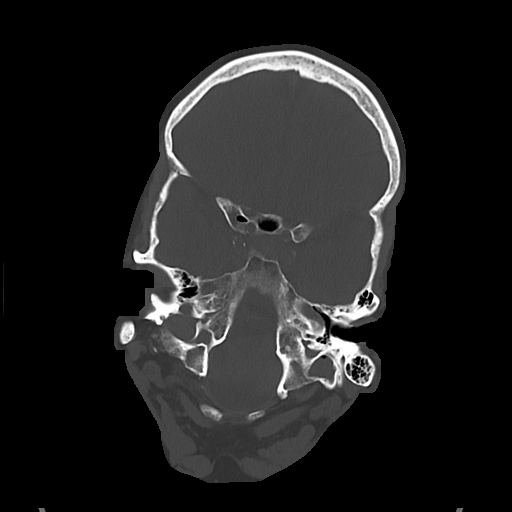

[Series 5: cor soft · coronal · 0.32mm/px · 3 of 73 slices shown]
[im 27/73  brain]
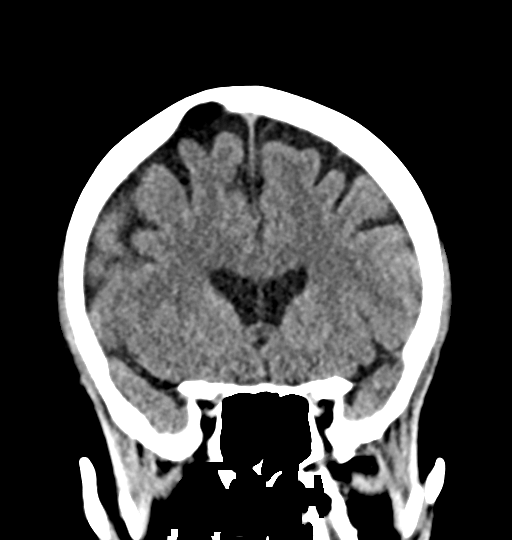
[im 33/73  brain]
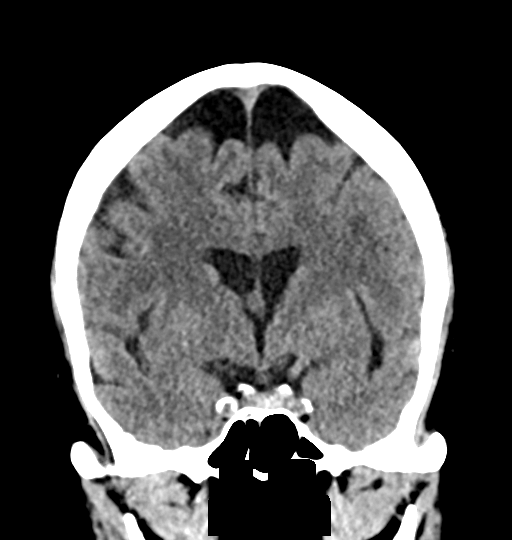
[im 40/73  brain]
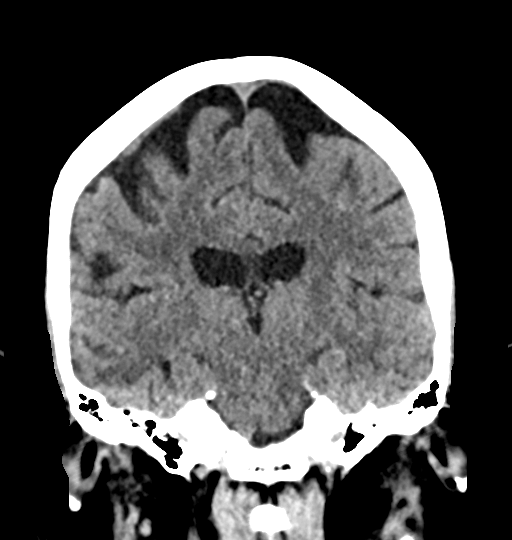

[Series 6: sag soft · sagittal · 0.36mm/px · 3 of 55 slices shown]
[im 19/55  brain]
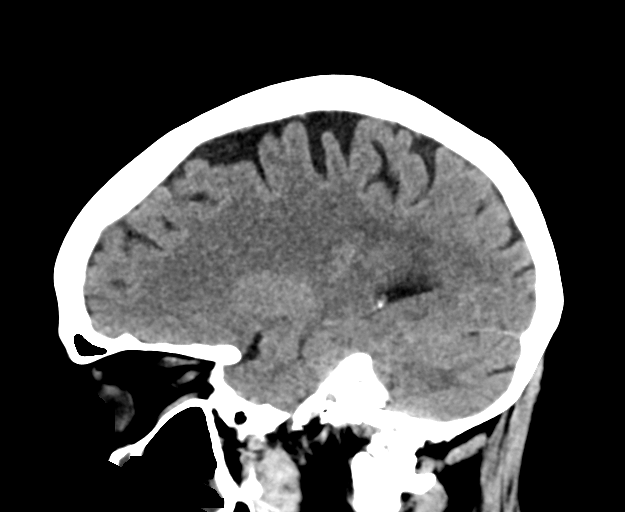
[im 28/55  brain]
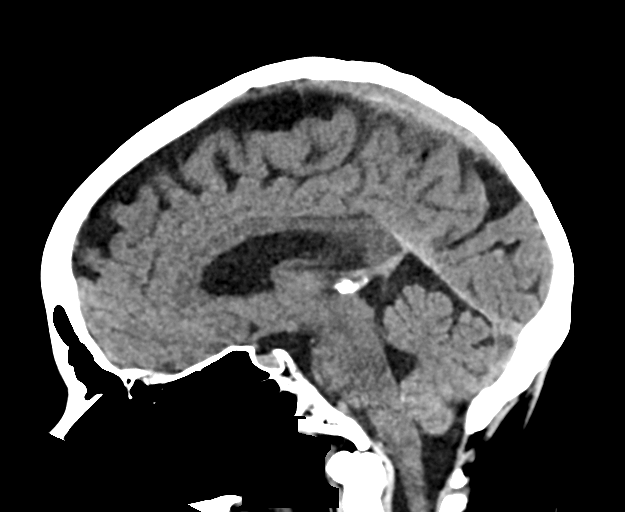
[im 37/55  brain]
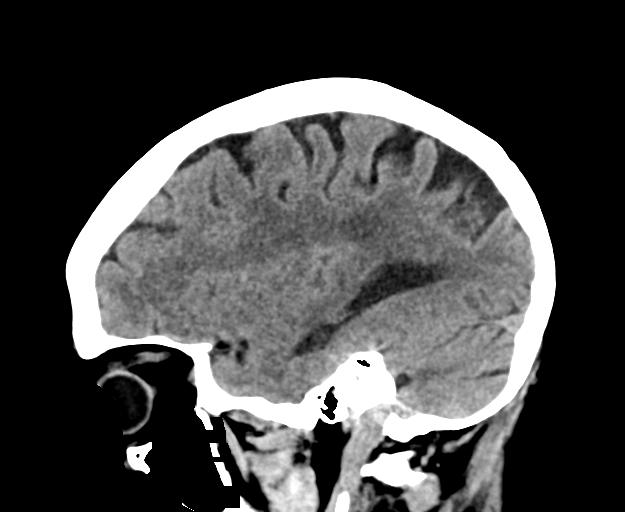

[16 of 47 positions shown; findings below may reference images not displayed]

FINDINGS: Brain: There is no evidence of an acute infarct, intracranial
hemorrhage, mass, midline shift, or extra-axial fluid collection.
Hypodensities in the cerebral white matter bilaterally are
nonspecific but compatible with mild chronic small vessel ischemic
disease. Mild cerebral atrophy is within normal limits for age.

Vascular: Calcified atherosclerosis at the skull base. No hyperdense
vessel.

Skull: No fracture or suspicious osseous lesion.

Sinuses/Orbits: Moderate mucosal thickening in the left maxillary
sinus. Clear mastoid air cells. Unremarkable orbits.

Other: None.

ASPECTS (Alberta Stroke Program Early CT Score)

- Ganglionic level infarction (caudate, lentiform nuclei, internal
capsule, insula, M1-M3 cortex): 7

- Supraganglionic infarction (M4-M6 cortex): 3

Total score (0-10 with 10 being normal): 10
IMPRESSION: 1. No evidence of acute intracranial abnormality.
2. ASPECTS is 10.
3. Mild chronic small vessel ischemic disease.

These results were communicated to [REDACTED] at [DATE] on
[DATE] by text page via the [employer] messaging system.

## 2021-05-05 IMAGING — MR MR MRA NECK WO/W CM
4 of 7 series · 18 of 48 positions shown · IV contrast (Yes GAD)
Comparison: Head CT [DATE]

CLINICAL DATA: Neuro deficit, acute, stroke suspected. Slurred
speech.

EXAM:
MRI HEAD WITHOUT CONTRAST
MRA HEAD WITHOUT CONTRAST
MRA OF THE NECK WITHOUT AND WITH CONTRAST
TECHNIQUE: Multiplanar, multi-echo pulse sequences of the brain and surrounding
structures were acquired without intravenous contrast. Angiographic
images of the Circle of Willis were acquired using MRA technique
without intravenous contrast. Angiographic images of the neck were
acquired using MRA technique without and with intravenous contrast.
Carotid stenosis measurements (when applicable) are obtained
utilizing NASCET criteria, using the distal internal carotid
diameter as the denominator.
CONTRAST:  7.1mL GADAVIST GADOBUTROL 1 MMOL/ML IV SOLN

[Series 1300: cor cemra ft · coronal · 1.2mm · 0.59mm/px · 7 of 105 slices shown]
[im 1/105]
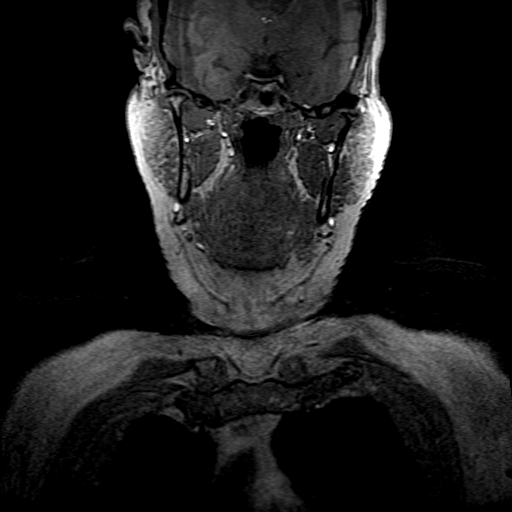
[im 18/105]
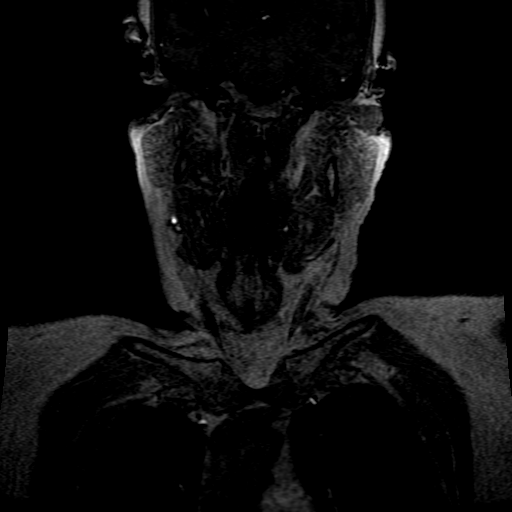
[im 35/105]
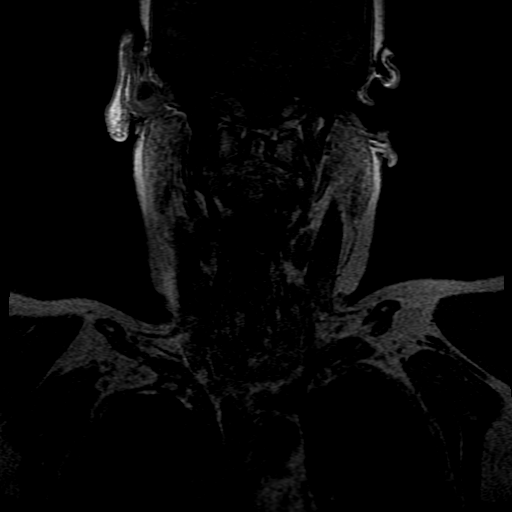
[im 53/105]
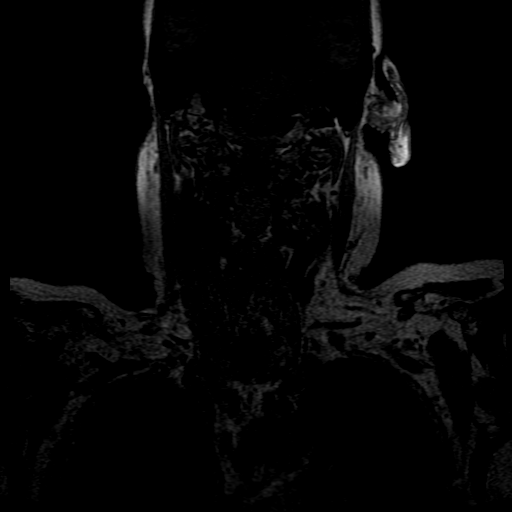
[im 70/105]
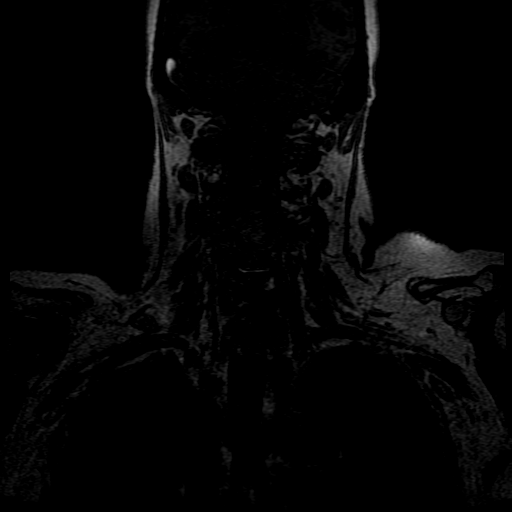
[im 87/105]
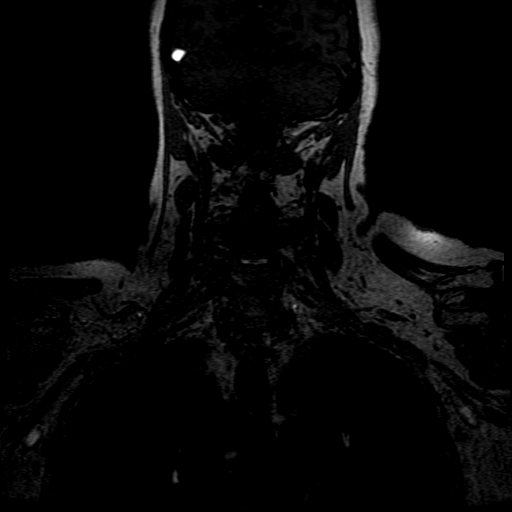
[im 105/105]
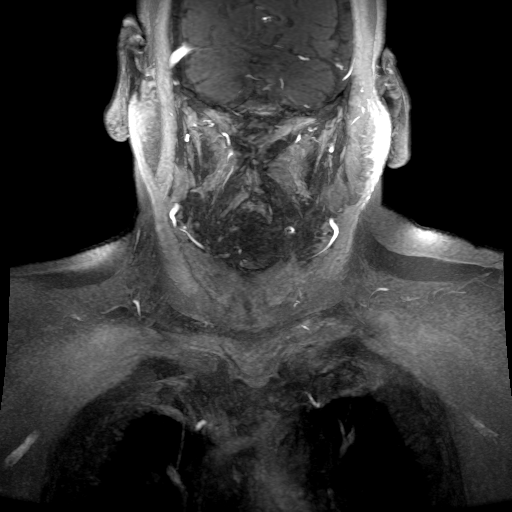

[Series 1301: ph1/cor cemra ft · coronal · 1.2mm · 0.59mm/px · 5 of 104 slices shown]
[im 1/104]
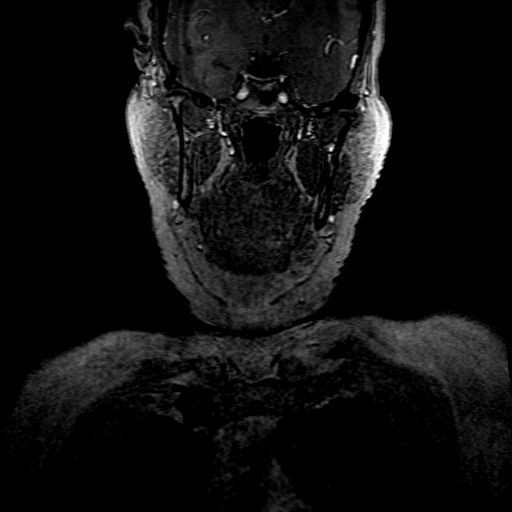
[im 18/104]
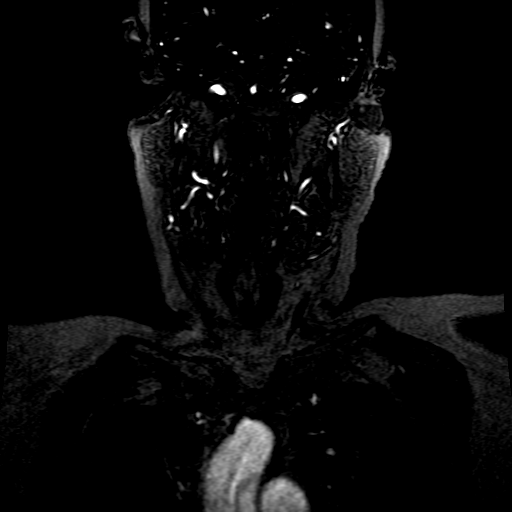
[im 35/104]
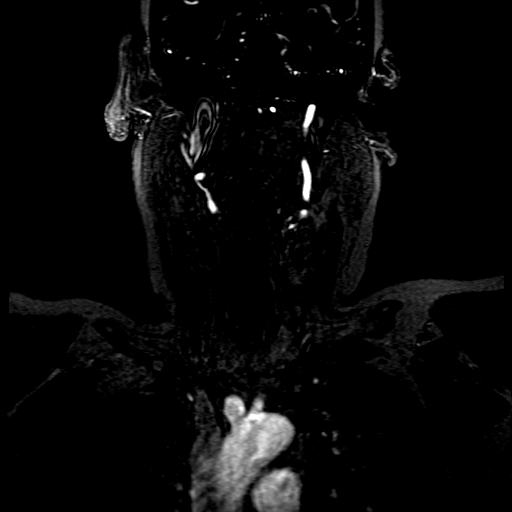
[im 52/104]
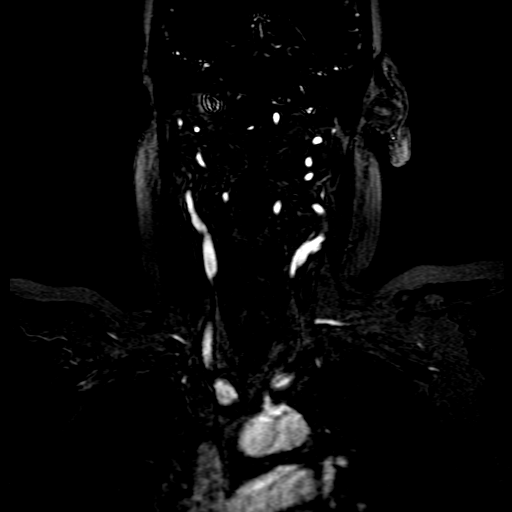
[im 86/104]
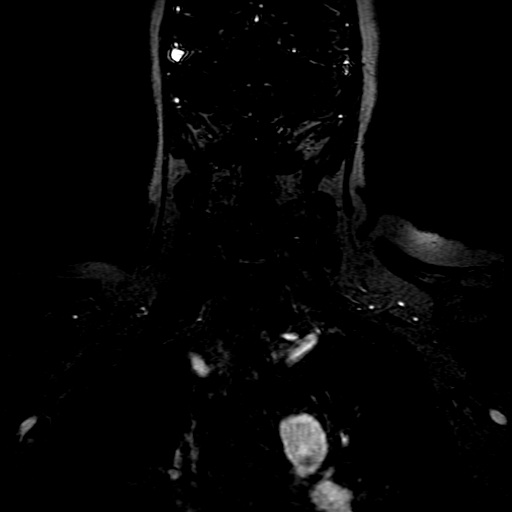

[Series 1302: ph2/cor cemra ft · coronal · 1.2mm · 0.59mm/px · 3 of 104 slices shown]
[im 18/104]
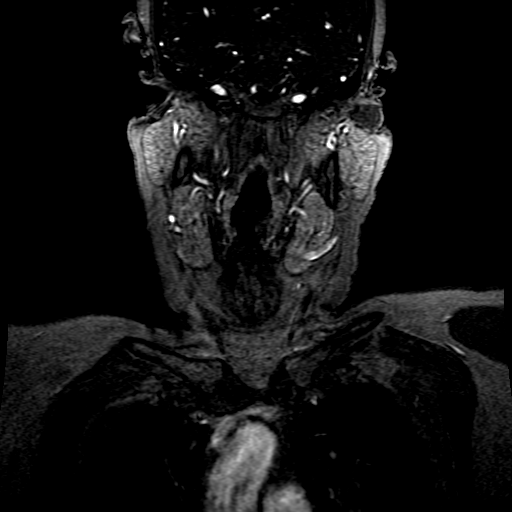
[im 52/104]
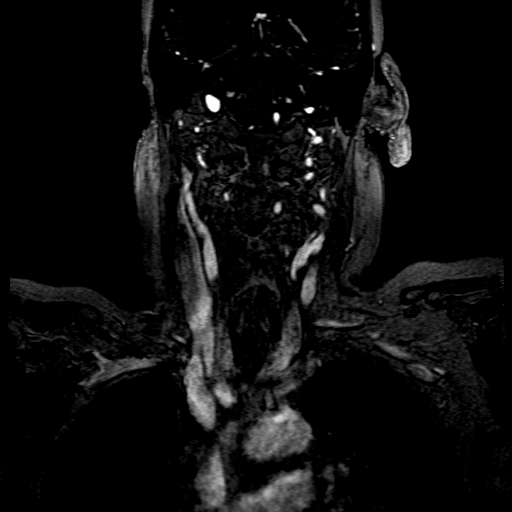
[im 86/104]
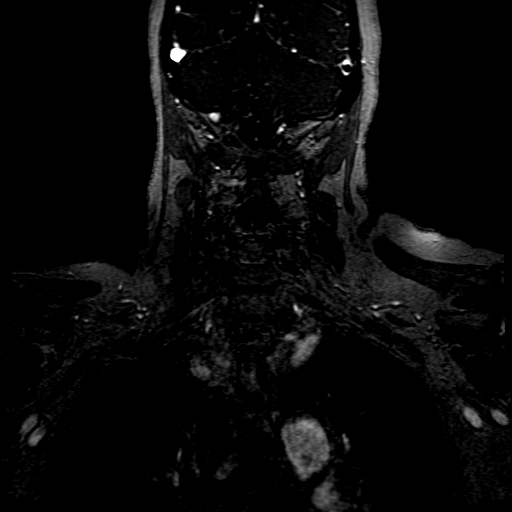

[((date))-((date)) · coronal · 1.2mm · 0.59mm/px · 3 of 105 slices shown]
[im 18/105]
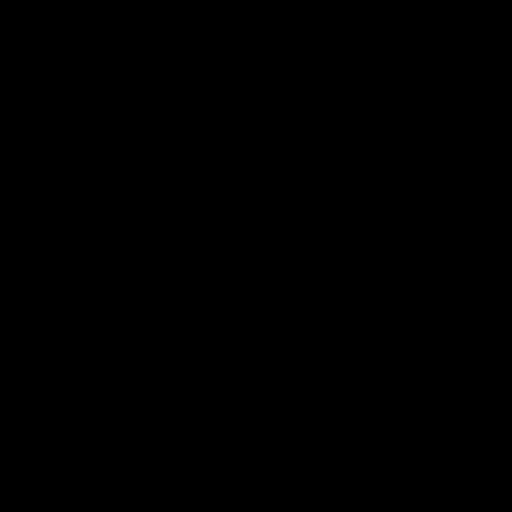
[im 53/105]
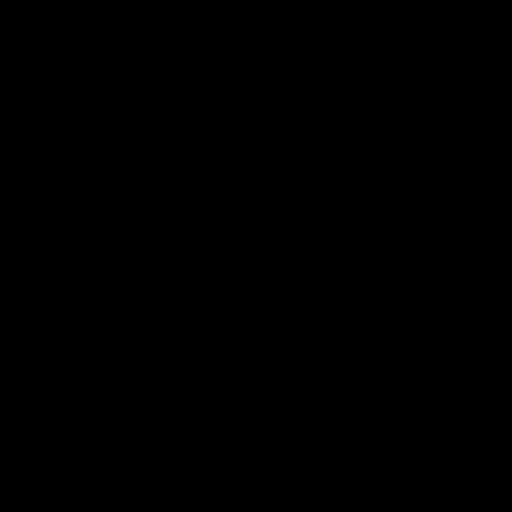
[im 87/105]
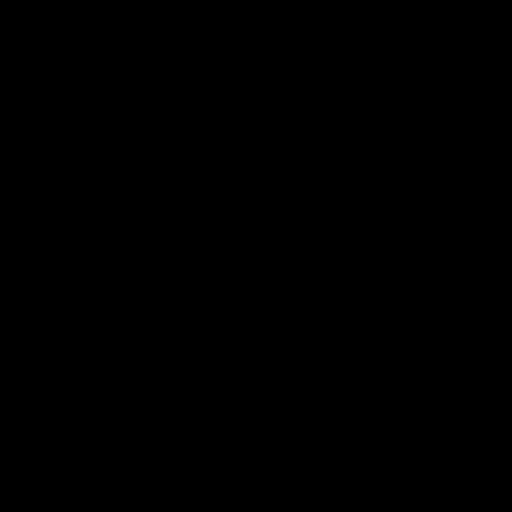

[18 of 48 positions shown; findings below may reference images not displayed]

FINDINGS: MR HEAD FINDINGS

Brain: There is a small acute infarct involving the right insula.
Patchy T2 hyperintensities in the cerebral white matter and pons are
nonspecific but compatible with moderate chronic small vessel
ischemic disease. Mild cerebral atrophy is within normal limits for
age. No intracranial hemorrhage, mass, midline shift, or extra-axial
fluid collection is identified.

Vascular: Major intracranial vascular flow voids are preserved.

Skull and upper cervical spine: Unremarkable bone marrow signal.

Sinuses/Orbits: Unremarkable orbits. Moderate mucosal thickening in
the left maxillary sinus. Small bilateral mastoid effusions.

Other: None.

MRA HEAD FINDINGS

Anterior circulation: The internal carotid arteries are widely
patent from skull base to carotid termini. The ACAs and MCAs are
patent without evidence of a proximal branch occlusion or
significant proximal stenosis. No aneurysm is identified.

Posterior circulation: The included intracranial portions of the
vertebral arteries are patent to the basilar with the left being
strongly dominant. The basilar artery is widely patent. There are
moderate-sized posterior communicating arteries bilaterally. Both
PCAs are patent without evidence of a significant proximal stenosis.
No aneurysm is identified.

Anatomic variants: Duplicated middle cerebral arteries bilaterally.
Hypoplastic right vertebral artery.

MRA NECK FINDINGS

The study is mildly motion degraded.

Aortic arch: Normal variant 4 vessel aortic arch with the left
vertebral artery arising from the arch. Widely patent arch vessel
origins.

Right carotid system: Patent without evidence of a significant
stenosis or dissection.

Left carotid system: Patent without evidence of a significant
stenosis or dissection.

Vertebral arteries: The vertebral arteries are patent with antegrade
flow bilaterally and with the left being dominant. Motion artifact
limits assessment of the vertebral artery origins, however there is
no evidence of a significant stenosis or dissection on either side
more distally.
IMPRESSION: 1. Small acute infarct involving the right insula.
2. Moderate chronic small vessel ischemic disease.
3. Negative head MRA.
4. Negative neck MRA within limitations of mild motion artifact.

## 2021-05-05 IMAGING — CR DG CERVICAL SPINE 2 OR 3 VIEWS
3 series · 3 of 3 positions shown · non-contrast
Comparison: None.

CLINICAL DATA: Neck pain on the right side.

EXAM:
CERVICAL SPINE - 2-3 VIEW

[c-spine lat]
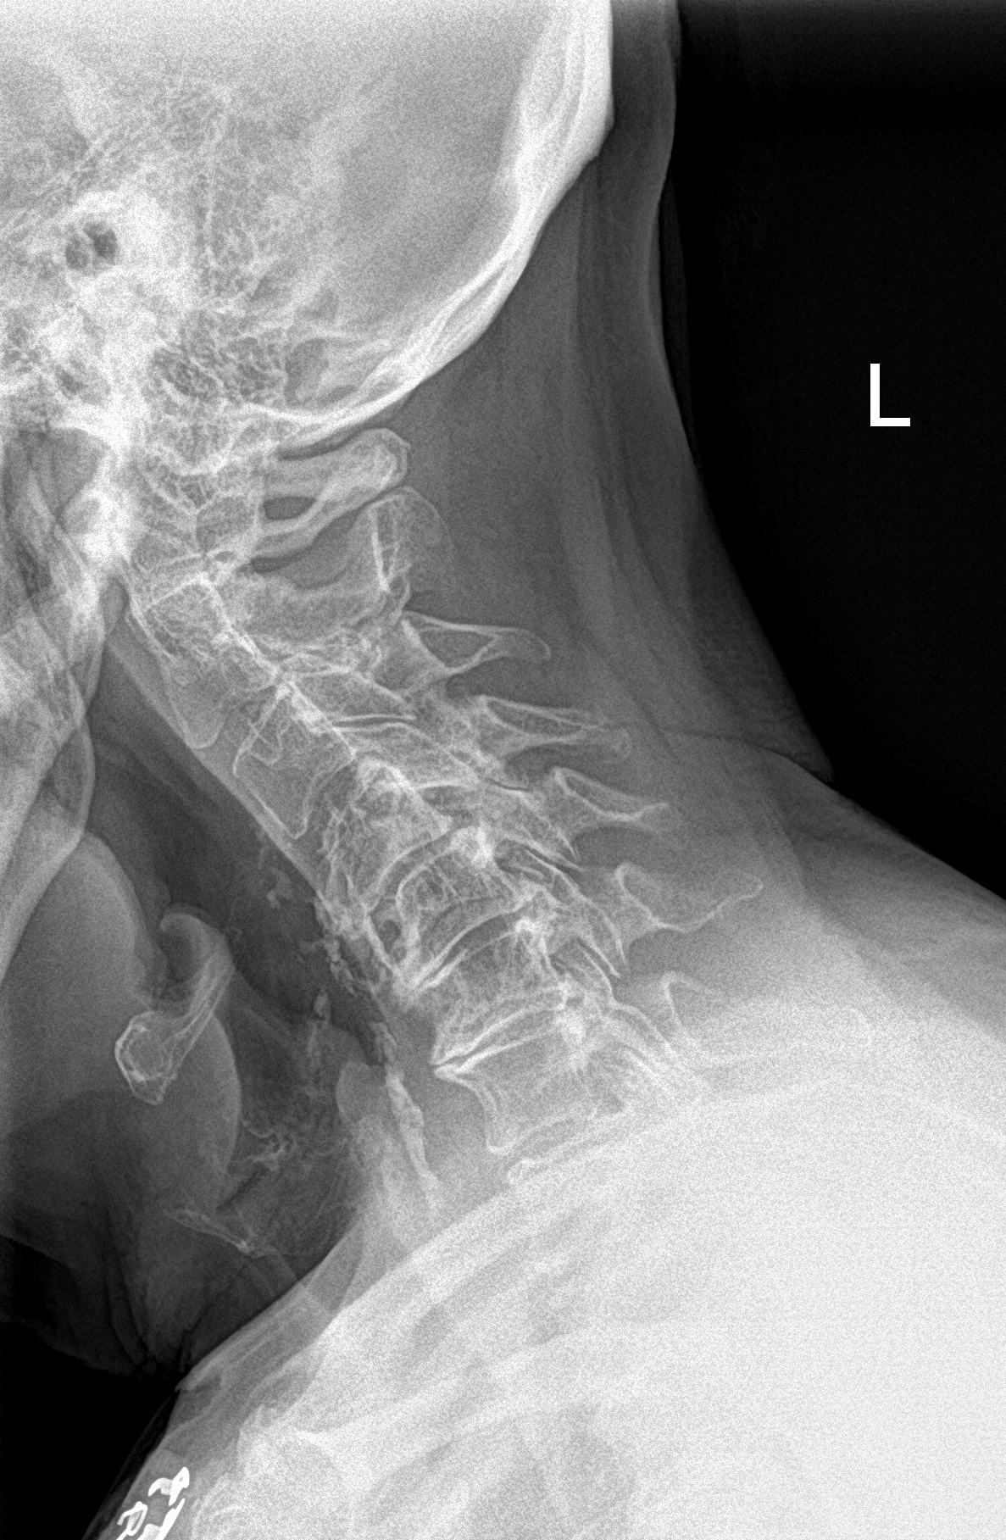

[c-spine ap]
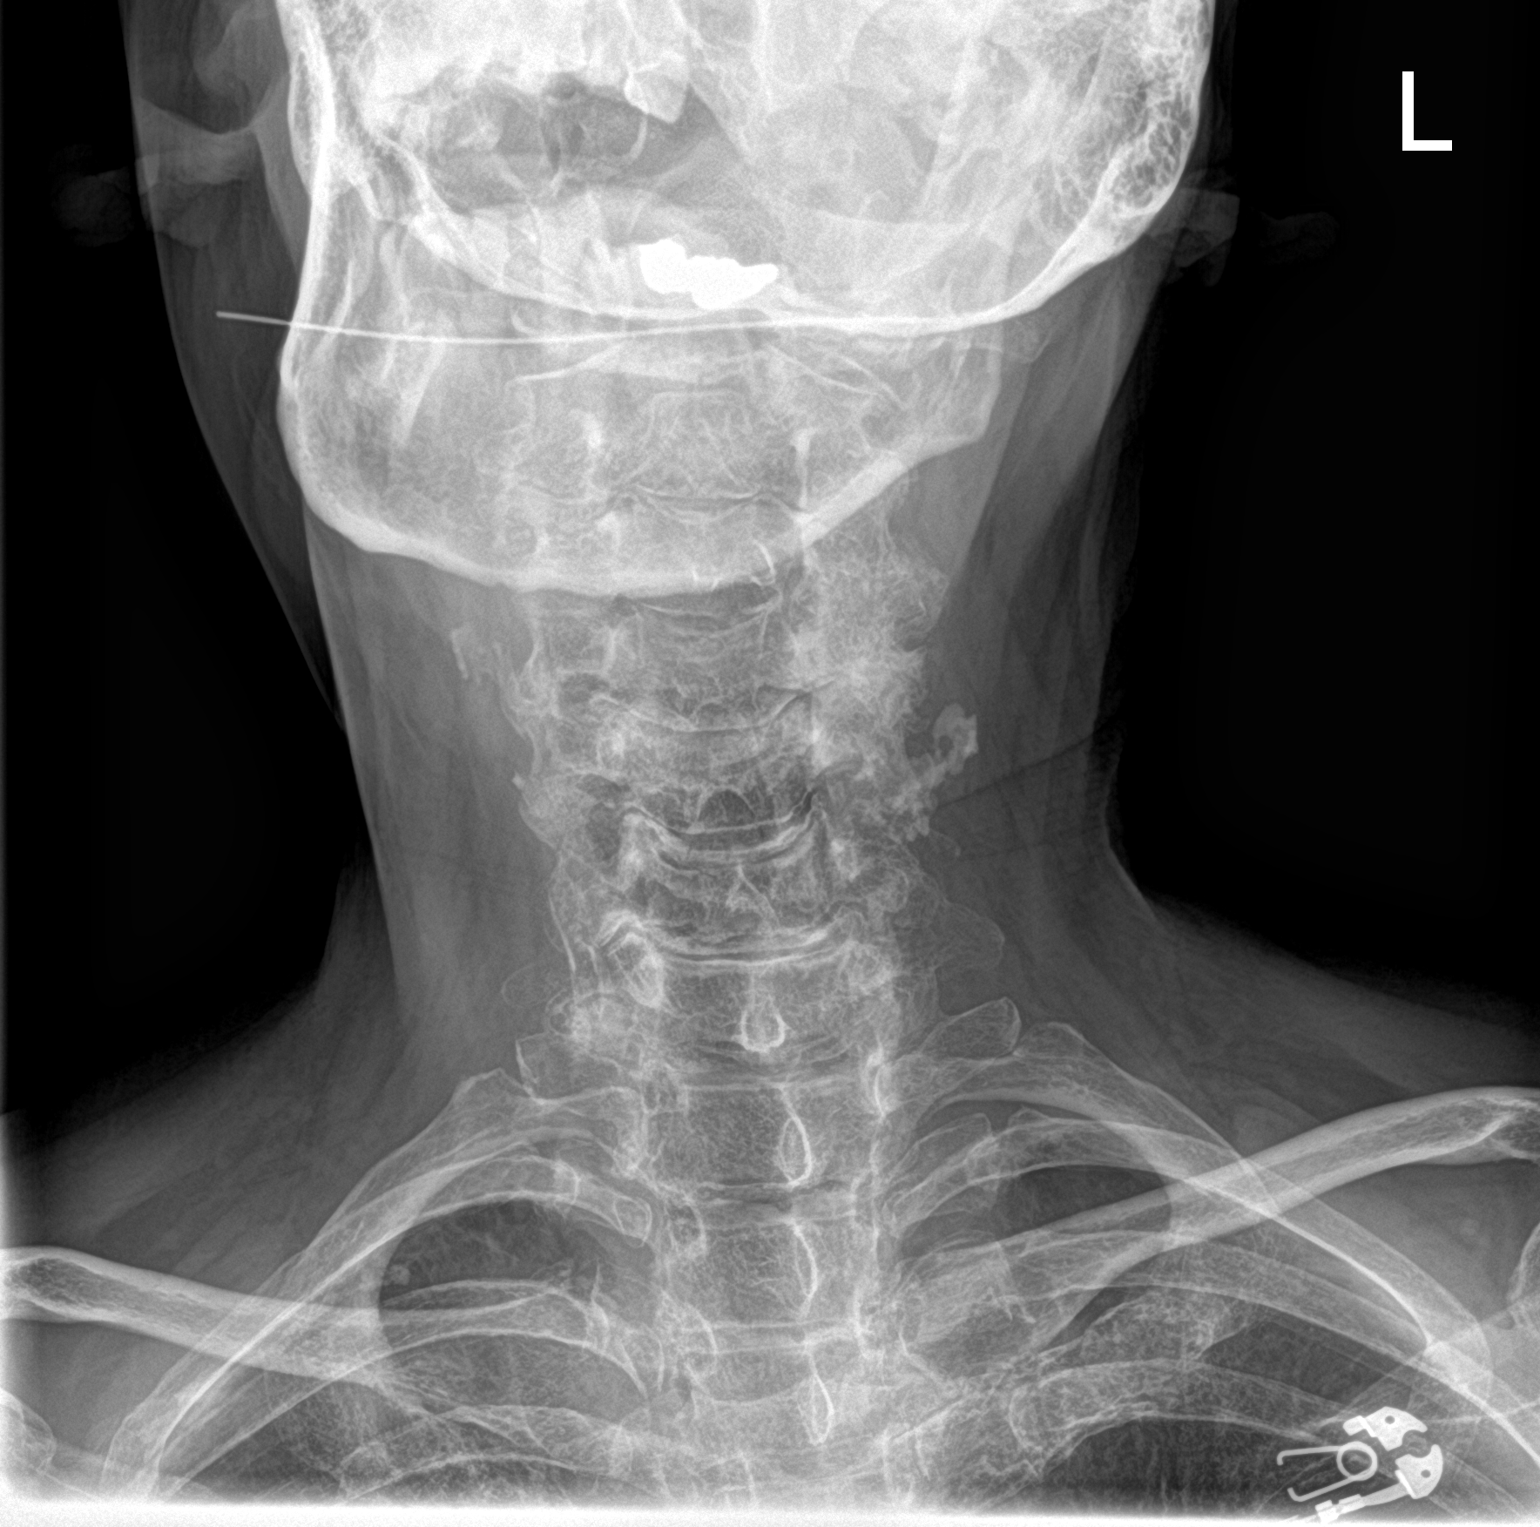

[c-spine open mouth]
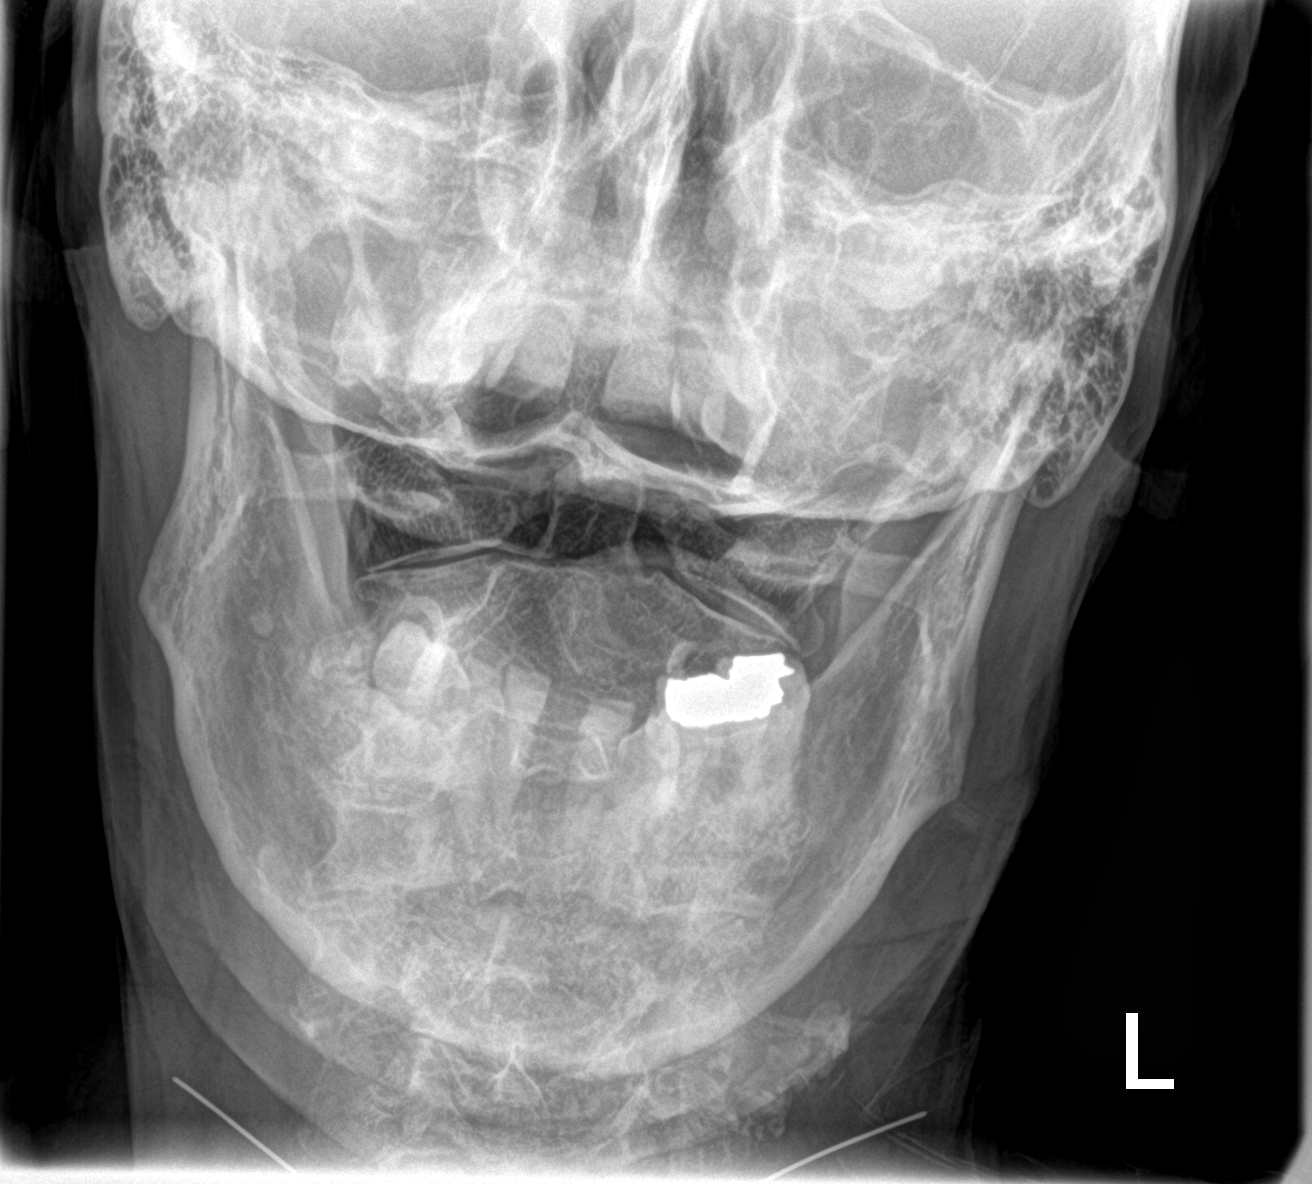

[3 of 3 positions shown; findings below may reference images not displayed]

FINDINGS: There is no evidence of cervical spine fracture or prevertebral soft
tissue swelling. There is cervical kyphosis centered at C4-5. There
are moderate to severe degenerative changes from C3-4 to C6-7. There
is an anterior wedge deformity of the C4 vertebral body with less
than 25% height loss which appears chronic.
IMPRESSION: Moderate to severe degenerative changes in the mid cervical spine.
No acute osseous injury.

## 2021-05-05 MED ORDER — ACETAMINOPHEN 325 MG PO TABS
650.0000 mg | ORAL_TABLET | ORAL | Status: DC | PRN
  Administered 2021-05-06: 650 mg via ORAL
  Filled 2021-05-05 (×2): qty 2

## 2021-05-05 MED ORDER — LEVALBUTEROL HCL 0.63 MG/3ML IN NEBU: 0.6300 mg | INHALATION_SOLUTION | Freq: Three times a day (TID) | RESPIRATORY_TRACT | Status: DC | PRN

## 2021-05-05 MED ORDER — ACETAMINOPHEN 160 MG/5ML PO SOLN: 650.0000 mg | ORAL | Status: DC | PRN

## 2021-05-05 MED ORDER — SODIUM CHLORIDE 0.9% FLUSH
3.0000 mL | Freq: Once | INTRAVENOUS | Status: AC
  Administered 2021-05-05: 3 mL via INTRAVENOUS

## 2021-05-05 MED ORDER — DILTIAZEM HCL ER COATED BEADS 120 MG PO CP24: 120.0000 mg | ORAL_CAPSULE | Freq: Two times a day (BID) | ORAL | Status: DC

## 2021-05-05 MED ORDER — PANTOPRAZOLE SODIUM 40 MG PO TBEC
40.0000 mg | DELAYED_RELEASE_TABLET | Freq: Every day | ORAL | Status: DC
  Administered 2021-05-05 – 2021-05-09 (×5): 40 mg via ORAL
  Filled 2021-05-05 (×5): qty 1

## 2021-05-05 MED ORDER — CLOTRIMAZOLE 1 % EX CREA
TOPICAL_CREAM | Freq: Two times a day (BID) | CUTANEOUS | Status: DC
  Filled 2021-05-05 (×2): qty 15

## 2021-05-05 MED ORDER — ATORVASTATIN CALCIUM 80 MG PO TABS
80.0000 mg | ORAL_TABLET | Freq: Every day | ORAL | Status: DC
  Administered 2021-05-05 – 2021-05-06 (×2): 80 mg via ORAL
  Filled 2021-05-05 (×2): qty 1

## 2021-05-05 MED ORDER — LEFLUNOMIDE 20 MG PO TABS
20.0000 mg | ORAL_TABLET | Freq: Every day | ORAL | Status: DC
  Administered 2021-05-05 – 2021-05-09 (×5): 20 mg via ORAL
  Filled 2021-05-05 (×5): qty 1

## 2021-05-05 MED ORDER — NYSTATIN 100000 UNIT/GM EX POWD
Freq: Two times a day (BID) | CUTANEOUS | Status: DC
  Filled 2021-05-05 (×2): qty 15

## 2021-05-05 MED ORDER — ACETAMINOPHEN 650 MG RE SUPP: 650.0000 mg | RECTAL | Status: DC | PRN

## 2021-05-05 MED ORDER — LIDOCAINE 5 % EX PTCH
1.0000 | MEDICATED_PATCH | CUTANEOUS | Status: DC
  Administered 2021-05-05 – 2021-05-06 (×2): 1 via TRANSDERMAL
  Filled 2021-05-05 (×4): qty 1

## 2021-05-05 MED ORDER — GADOBUTROL 1 MMOL/ML IV SOLN
7.1000 mL | Freq: Once | INTRAVENOUS | Status: AC | PRN
  Administered 2021-05-05: 7.1 mL via INTRAVENOUS

## 2021-05-05 MED ORDER — FLUTICASONE-UMECLIDIN-VILANT 100-62.5-25 MCG/INH IN AEPB: 1.0000 | INHALATION_SPRAY | Freq: Every day | RESPIRATORY_TRACT | Status: DC

## 2021-05-05 MED ORDER — PREDNISONE 5 MG PO TABS
5.0000 mg | ORAL_TABLET | Freq: Every day | ORAL | Status: DC
  Administered 2021-05-06 – 2021-05-09 (×4): 5 mg via ORAL
  Filled 2021-05-05 (×5): qty 1

## 2021-05-05 MED ORDER — STROKE: EARLY STAGES OF RECOVERY BOOK
Freq: Once | Status: AC
  Filled 2021-05-05: qty 1

## 2021-05-05 MED ORDER — FLUTICASONE FUROATE-VILANTEROL 100-25 MCG/INH IN AEPB
1.0000 | INHALATION_SPRAY | Freq: Every day | RESPIRATORY_TRACT | Status: DC
  Administered 2021-05-05 – 2021-05-09 (×5): 1 via RESPIRATORY_TRACT
  Filled 2021-05-05: qty 28

## 2021-05-05 MED ORDER — FLECAINIDE ACETATE 50 MG PO TABS
50.0000 mg | ORAL_TABLET | Freq: Two times a day (BID) | ORAL | Status: DC
  Administered 2021-05-05 – 2021-05-09 (×8): 50 mg via ORAL
  Filled 2021-05-05 (×10): qty 1

## 2021-05-05 MED ORDER — CLOPIDOGREL BISULFATE 75 MG PO TABS
75.0000 mg | ORAL_TABLET | Freq: Every day | ORAL | Status: DC
  Administered 2021-05-05 – 2021-05-09 (×5): 75 mg via ORAL
  Filled 2021-05-05 (×5): qty 1

## 2021-05-05 MED ORDER — UMECLIDINIUM BROMIDE 62.5 MCG/INH IN AEPB
1.0000 | INHALATION_SPRAY | Freq: Every day | RESPIRATORY_TRACT | Status: DC
  Administered 2021-05-05 – 2021-05-09 (×5): 1 via RESPIRATORY_TRACT
  Filled 2021-05-05: qty 7

## 2021-05-05 MED ORDER — ENOXAPARIN SODIUM 40 MG/0.4ML IJ SOSY
40.0000 mg | PREFILLED_SYRINGE | INTRAMUSCULAR | Status: DC
  Administered 2021-05-05 – 2021-05-08 (×4): 40 mg via SUBCUTANEOUS
  Filled 2021-05-05 (×4): qty 0.4

## 2021-05-05 NOTE — H&P (Signed)
History and Physical    [name] SKA:[id number] DOB: [birth year] DOA: 05/05/2021  PCP: Shellia Cleverly, PA Consultants:  rheumatology: Dr. Sharmon Revere at [facility], cardiologist: Dr. Sampson Goon  pulm: Dr. Su Monks Patient coming from:  Home via EMS - lives with her daughter   Chief Complaint: slurred speech, left mouth droop and drooling.   HPI: Sandra Nielsen is a 70 y.o. female with medical history significant of SVT on flecainide, breast cancer s/p right mastectomy, chronic bronchitis, RA, HLD and tobacco abuse who presented to ER with slurred speech and drooling that started around 9AM this morning. She was sitting in her recliner and her son called her around 9:30 and noticed her speech was slurred. He knew something wasn't right so he called his sister. Her son drove over and noticed her left side of her face was droopy and she was drooling on the left side. She also had dysarthric speech so they called 911. The son states her speech would improve and then go back. He feels like she has much improved now since returning from her MRI. She denies any left arm or leg weakness. She denies any headache, vision changes, chest pain, palpitations, stomach pain, N/V/D. No fever/chills. She has not been sick recently. Did have incidental finding of covid 7 weeks ago when she was admitted for a pelvis fracture.   She also has complained of right neck pain x 2 days, like she has crook in her neck. No she denies any weakness in her right arm or tingling down her arm or fingers.   She has had a cough recently, but at her baseline with smoking.    ED Course: vitals: afebrile, bp: 111/64, HR: 81, RR: 17 and oxygen 98% on room air. EKG wnl, CTH no acute findings. No pertinent labs. Neurology consulted. We were asked to admit for stroke rule out.   Review of Systems: As per HPI; otherwise review of systems reviewed and negative.   Ambulatory Status:  Ambulates with walker since pelvic fracture    Past Medical  History:  Diagnosis Date   Female cystocele    History of breast cancer    S/P RIGHT MASTECTOMY / NO CHEMORADIATION/   NO RECURRENCE   History of chronic bronchitis    Smokers' cough (HCC)    Wears glasses     Past Surgical History:  Procedure Laterality Date   CYSTOCELE REPAIR N/A 09/27/2013   Procedure: BOSTON SCIENTIFIC UPHOLD LITE SACROSPINOUS LIGAMENT REPAIR , Rectocele Repair with Xenform, Cystocele repair with Xenform, Vaginal Vault Suspension, Joelene Millin;  Surgeon: Kathi Ludwig, MD;  Location: [facility];  Service: Urology;  Laterality: N/A;   LAPAROTOMY W/ BILATERAL SALPINGOOPHORECTOMY  JAN 1996   LEFT ANKLE RECONSTRUCTION  10-06-1979   MASTECTOMY WITH AXILLARY LYMPH NODE DISSECTION Right 02-16-1993   VAGINAL HYSTERECTOMY  1981    Social History   Socioeconomic History   Marital status: Widowed    Spouse name: Not on file   Number of children: Not on file   Years of education: Not on file   Highest education level: Not on file  Occupational History   Not on file  Tobacco Use   Smoking status: Every Day    Packs/day: 1.00    Years: 40.00    Pack years: 40.00    Types: Cigarettes   Smokeless tobacco: Never  Substance and Sexual Activity   Alcohol use: No   Drug use: No   Sexual activity: Not on file  Other  Topics Concern   Not on file  Social History Narrative   Not on file   Social Determinants of Health   Financial Resource Strain: Not on file  Food Insecurity: Not on file  Transportation Needs: Not on file  Physical Activity: Not on file  Stress: Not on file  Social Connections: Not on file  Intimate Partner Violence: Not on file    Allergies  Allergen Reactions   Meloxicam Swelling    Other reaction(s): Swelling    Penicillins Hives and Swelling   Levofloxacin Palpitations    No family history on file.  Prior to Admission medications   Medication Sig Start Date End Date Taking? Authorizing Provider   atorvastatin (LIPITOR) 20 MG tablet TAKE 1 TABLET BY MOUTH EACH DAY FOR CHOLESTEROL 07/01/19   [provider]  azelastine (ASTELIN) 0.1 % nasal spray Place into the nose. 12/17/18   [provider]  diltiazem (CARDIZEM CD) 120 MG 24 hr capsule Take 120 mg by mouth 2 (two) times daily. 12/10/19   [provider]  diltiazem (TIAZAC) 120 MG 24 hr capsule TAKE 1 CAPSULE BY MOUTH 2 TIMES DAILY 12/10/19   [provider]  flecainide (TAMBOCOR) 50 MG tablet Take 50 mg by mouth 2 (two) times daily. 11/16/19   [provider]  HYDROcodone-acetaminophen (NORCO/VICODIN) 5-325 MG per tablet Take 1-2 tablets by mouth every 6 (six) hours as needed for moderate pain or severe pain. 11/13/14   Presson, Mathis FareJennifer Lee H, PA  leflunomide (ARAVA) 10 MG tablet Take 20 mg by mouth daily. 11/25/19   [provider]  levalbuterol Pauline Aus(XOPENEX) 0.63 MG/3ML nebulizer solution Inhale into the lungs. 03/06/18   [provider]  levofloxacin (LEVAQUIN) 500 MG tablet Take 1 tablet (500 mg total) by mouth daily. 01/09/15   Charm RingsHonig, Erin J, MD  metoprolol succinate (TOPROL-XL) 25 MG 24 hr tablet Take 25 mg by mouth daily. 11/25/19   [provider]  mupirocin cream (BACTROBAN) 2 % Apply 1 application topically 2 (two) times daily. 12/24/19   Vivi BarrackWagoner, Matthew R, DPM  omeprazole (PRILOSEC) 40 MG capsule Take 40 mg by mouth at bedtime. 07/01/19   [provider]  oxyCODONE-acetaminophen (ROXICET) 5-325 MG per tablet Take 1 tablet by mouth every 4 (four) hours as needed for severe pain. 09/27/13   Jethro Bolusannenbaum, Sigmund, MD  pantoprazole (PROTONIX) 40 MG tablet Take 40 mg by mouth daily. 12/17/19   [provider]  Polyethylene Glycol 3350 (MIRALAX PO) Take by mouth daily.    [provider]  predniSONE (DELTASONE) 50 MG tablet Take 1 pill daily for 5 days. 01/09/15   Charm RingsHonig, Erin J, MD  TRELEGY ELLIPTA 100-62.5-25 MCG/INH AEPB Inhale 1 puff into the lungs daily.  12/17/19   [provider]  valACYclovir (VALTREX) 1000 MG tablet Take 1 tablet (1,000 mg total) by mouth 3 (three) times daily. X 7 days 11/13/14   Ria Clockresson, Jennifer Lee H, [place]    Physical Exam: Vitals:   05/05/21 1235 05/05/21 1245 05/05/21 1300 05/05/21 1315  BP: 111/64 122/62 113/65   Pulse: 82 84 83 77  Resp: (!) 21 (!) 21 17 (!) 21  Temp: 98.2 F (36.8 C)     TempSrc: Oral     SpO2: 98% 97% 99% 96%  Weight: 71.6 kg        General:  Appears calm and comfortable and is in NAD Eyes:  PERRL, EOMI, normal lids, iris ENT:  grossly normal hearing, lips & tongue, mmm; appropriate  dentition Neck:  no LAD, masses or thyromegaly; no carotid bruits Cardiovascular:  RRR, no m/r/g. +pitting edema in left foot/ankle  Respiratory:   CTA bilaterally with no wheezes/rales/rhonchi.  Normal respiratory effort. Abdomen:  soft, NT, ND, NABS Back:   normal alignment, no CVAT Skin:  has intertriginous yeast below her pannus  Musculoskeletal:  grossly normal tone BUE/BLE, good ROM, no bony abnormality Lower extremity:  Limited foot exam with no ulcerations.  2+ distal pulses. Psychiatric:  grossly normal mood and affect, speech very mildly slurred, not dysarthric and appropriate, AOx3 Neurologic:  CN 2-12 grossly intact with the following: VII: left facial droop.  moves all extremities in coordinated fashion, sensation intact. No pronator drift.  Heel to shin intact bilaterally.  DTR 2+. Gait deferred.     Radiological Exams on Admission: Independently reviewed - see discussion in A/P where applicable  MR ANGIO HEAD WO CONTRAST  Result Date: 05/05/2021 CLINICAL DATA:  Neuro deficit, acute, stroke suspected. Slurred speech. EXAM: MRI HEAD WITHOUT CONTRAST MRA HEAD WITHOUT CONTRAST MRA OF THE NECK WITHOUT AND WITH CONTRAST TECHNIQUE: Multiplanar, multi-echo pulse sequences of the brain and surrounding structures were acquired without intravenous contrast. Angiographic images of the Circle of  Will[place] were acquired using MRA technique without intravenous contrast. Angiographic images of the neck were acquired using MRA technique without and with intravenous contrast. Carotid stenosis measurements (when applicable) are obtained utilizing NASCET criteria, using the distal internal carotid diameter as the denominator. CONTRAST:  7.33mL GADAVIST GADOBUTROL 1 MMOL/ML IV SOLN COMPARISON:  Head CT 05/05/2020 FINDINGS: MR HEAD FINDINGS Brain: There is a small acute infarct involving the right insula. Patchy T2 hyperintensities in the cerebral white matter and pons are nonspecific but compatible with moderate chronic small vessel ischemic disease. Mild cerebral atrophy is within normal limits for age. No intracranial hemorrhage, mass, midline shift, or extra-axial fluid collection is identified. Vascular: Major intracranial vascular flow voids are preserved. Skull and upper cervical spine: Unremarkable bone marrow signal. Sinuses/Orbits: Unremarkable orbits. Moderate mucosal thickening in the left maxillary sinus. Small bilateral mastoid effusions. Other: None. MRA HEAD FINDINGS Anterior circulation: The internal carotid arteries are widely patent from skull base to carotid termini. The ACAs and MCAs are patent without evidence of a proximal branch occlusion or significant proximal stenosis. No aneurysm is identified. Posterior circulation: The included intracranial portions of the vertebral arteries are patent to the basilar with the left being strongly dominant. The basilar artery is widely patent. There are moderate-sized posterior communicating arteries bilaterally. Both PCAs are patent without evidence of a significant proximal stenosis. No aneurysm is identified. Anatomic variants: Duplicated middle cerebral arteries bilaterally. Hypoplastic right vertebral artery. MRA NECK FINDINGS The study is mildly motion degraded. Aortic arch: Normal variant 4 vessel aortic arch with the left vertebral artery arising from  the arch. Widely patent arch vessel origins. Right carotid system: Patent without evidence of a significant stenosis or dissection. Left carotid system: Patent without evidence of a significant stenosis or dissection. Vertebral arteries: The vertebral arteries are patent with antegrade flow bilaterally and with the left being dominant. Motion artifact limits assessment of the vertebral artery origins, however there is no evidence of a significant stenosis or dissection on either side more distally. IMPRESSION: 1. 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 2. Moderate chronic small vessel ischemic disease. 3. Negative head MRA. 4. Negative neck MRA within limitations of mild motion artifact. Electronically Signed   By: Sebastian Ache M.D.   On: 05/05/2021 15:11  MR ANGIO NECK W WO CONTRAST  Result Date: 05/05/2021 CLINICAL DATA:  Neuro deficit, acute, stroke suspected. Slurred speech. EXAM: MRI HEAD WITHOUT CONTRAST MRA HEAD WITHOUT CONTRAST MRA OF THE NECK WITHOUT AND WITH CONTRAST TECHNIQUE: Multiplanar, multi-echo pulse sequences of the brain and surrounding structures were acquired without intravenous contrast. Angiographic images of the Circle of Willis were acquired using MRA technique without intravenous contrast. Angiographic images of the neck were acquired using MRA technique without and with intravenous contrast. Carotid stenosis measurements (when applicable) are obtained utilizing NASCET criteria, using the distal internal carotid diameter as the denominator. CONTRAST:  7.22mL GADAVIST GADOBUTROL 1 MMOL/ML IV SOLN COMPARISON:  Head CT 05/05/2020 FINDINGS: MR HEAD FINDINGS Brain: There is a small acute infarct involving the right insula. Patchy T2 hyperintensities in the cerebral white matter and pons are nonspecific but compatible with moderate chronic small vessel ischemic disease. Mild cerebral atrophy is within normal limits for age. No intracranial hemorrhage, mass, midline shift, or  extra-axial fluid collection is identified. Vascular: Major intracranial vascular flow voids are preserved. Skull and upper cervical spine: Unremarkable bone marrow signal. Sinuses/Orbits: Unremarkable orbits. Moderate mucosal thickening in the left maxillary sinus. Small bilateral mastoid effusions. Other: None. MRA HEAD FINDINGS Anterior circulation: The internal carotid arteries are widely patent from skull base to carotid termini. The ACAs and MCAs are patent without evidence of a proximal branch occlusion or significant proximal stenosis. No aneurysm is identified. Posterior circulation: The included intracranial portions of the vertebral arteries are patent to the basilar with the left being strongly dominant. The basilar artery is widely patent. There are moderate-sized posterior communicating arteries bilaterally. Both PCAs are patent without evidence of a significant proximal stenosis. No aneurysm is identified. Anatomic variants: Duplicated middle cerebral arteries bilaterally. Hypoplastic right vertebral artery. MRA NECK FINDINGS The study is mildly motion degraded. Aortic arch: Normal variant 4 vessel aortic arch with the left vertebral artery arising from the arch. Widely patent arch vessel origins. Right carotid system: Patent without evidence of a significant stenosis or dissection. Left carotid system: Patent without evidence of a significant stenosis or dissection. Vertebral arteries: The vertebral arteries are patent with antegrade flow bilaterally and with the left being dominant. Motion artifact limits assessment of the vertebral artery origins, however there is no evidence of a significant stenosis or dissection on either side more distally. IMPRESSION: 1. 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 2. Moderate chronic small vessel ischemic disease. 3. Negative head MRA. 4. Negative neck MRA within limitations of mild motion artifact. Electronically Signed   By: Sebastian Ache M.D.   On:  05/05/2021 15:11   MR BRAIN WO CONTRAST  Result Date: 05/05/2021 CLINICAL DATA:  Neuro deficit, acute, stroke suspected. Slurred speech. EXAM: MRI HEAD WITHOUT CONTRAST MRA HEAD WITHOUT CONTRAST MRA OF THE NECK WITHOUT AND WITH CONTRAST TECHNIQUE: Multiplanar, multi-echo pulse sequences of the brain and surrounding structures were acquired without intravenous contrast. Angiographic images of the Circle of Willis were acquired using MRA technique without intravenous contrast. Angiographic images of the neck were acquired using MRA technique without and with intravenous contrast. Carotid stenosis measurements (when applicable) are obtained utilizing NASCET criteria, using the distal internal carotid diameter as the denominator. CONTRAST:  7.27mL GADAVIST GADOBUTROL 1 MMOL/ML IV SOLN COMPARISON:  Head CT 05/05/2020 FINDINGS: MR HEAD FINDINGS Brain: There is a small acute infarct involving the right insula. Patchy T2 hyperintensities in the cerebral white matter and pons are nonspecific but compatible with moderate chronic small vessel ischemic  disease. Mild cerebral atrophy is within normal limits for age. No intracranial hemorrhage, mass, midline shift, or extra-axial fluid collection is identified. Vascular: Major intracranial vascular flow voids are preserved. Skull and upper cervical spine: Unremarkable bone marrow signal. Sinuses/Orbits: Unremarkable orbits. Moderate mucosal thickening in the left maxillary sinus. Small bilateral mastoid effusions. Other: None. MRA HEAD FINDINGS Anterior circulation: The internal carotid arteries are widely patent from skull base to carotid termini. The ACAs and MCAs are patent without evidence of a proximal branch occlusion or significant proximal stenosis. No aneurysm is identified. Posterior circulation: The included intracranial portions of the vertebral arteries are patent to the basilar with the left being strongly dominant. The basilar artery is widely patent. There are  moderate-sized posterior communicating arteries bilaterally. Both PCAs are patent without evidence of a significant proximal stenosis. No aneurysm is identified. Anatomic variants: Duplicated middle cerebral arteries bilaterally. Hypoplastic right vertebral artery. MRA NECK FINDINGS The study is mildly motion degraded. Aortic arch: Normal variant 4 vessel aortic arch with the left vertebral artery arising from the arch. Widely patent arch vessel origins. Right carotid system: Patent without evidence of a significant stenosis or dissection. Left carotid system: Patent without evidence of a significant stenosis or dissection. Vertebral arteries: The vertebral arteries are patent with antegrade flow bilaterally and with the left being dominant. Motion artifact limits assessment of the vertebral artery origins, however there is no evidence of a significant stenosis or dissection on either side more distally. IMPRESSION: 1. 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 2. Moderate chronic small vessel ischemic disease. 3. Negative head MRA. 4. Negative neck MRA within limitations of mild motion artifact. Electronically Signed   By: Sebastian Ache M.D.   On: 05/05/2021 15:11   CT HEAD CODE STROKE WO CONTRAST  Result Date: 05/05/2021 CLINICAL DATA:  Code stroke. Neuro deficit, acute, stroke suspected. Left facial droop and slurred speech. EXAM: CT HEAD WITHOUT CONTRAST TECHNIQUE: Contiguous axial images were obtained from the base of the skull through the vertex without intravenous contrast. COMPARISON:  None. FINDINGS: Brain: There is no evidence of an acute infarct, intracranial hemorrhage, mass, midline shift, or extra-axial fluid collection. Hypodensities in the cerebral white matter bilaterally are nonspecific but compatible with mild chronic small vessel ischemic disease. Mild cerebral atrophy is within normal limits for age. Vascular: Calcified atherosclerosis at the skull base. No hyperdense vessel. Skull: No  fracture or suspicious osseous lesion. Sinuses/Orbits: Moderate mucosal thickening in the left maxillary sinus. Clear mastoid air cells. Unremarkable orbits. Other: None. ASPECTS [facility] Stroke Program Early CT Score) - Ganglionic level infarction (caudate, lentiform nuclei, internal capsule, insula, M1-M3 cortex): 7 - Supraganglionic infarction (M4-M6 cortex): 3 Total score (0-10 with 10 being normal): 10 IMPRESSION: 1. No evidence of acute intracranial abnormality. 2. ASPECTS is 10. 3. Mild chronic small vessel ischemic disease. These results were communicated to Dr. Selina Cooley at 12:27 pm on 05/05/2021 by text page via the [facility] messaging system. Electronically Signed   By: Sebastian Ache M.D.   On: 05/05/2021 12:27    EKG: Independently reviewed.  NSR with rate 82; nonspecific ST changes with no evidence of acute ischemia   Labs on Admission: I have personally reviewed the available labs and imaging studies at the time of the admission.  Pertinent labs:  MCV: 108.9 CTH: no acute changes   Assessment/Plan Principal Problem:   Acute stroke due to ischemia [facility]) -MRI brain: small acute infarct involving right insula. MRA H&N with no acute finding. -neuro checks per  orders q 4 hours  -neurology following. Recommended stat CTH for any change in neuro exam.  -echo/A1c/lipid panel pending. Started on plavix 75mg /day (allergy to mobic so no ASA) and increased lipitor to 80mg .  -permissive HTN x 48 hours from stroke onset. Holding her beta blocker and cardizem. Low threshold to restart cardizem if heart rate not controlled.  -ST/OT/PT  -passed swallow screen -recommended neurology f/u on discharge.   Active Problems:   Frequent PVCs Rate well controlled. Continue flecainide. Will hold her beta blocker in light of CVA for 48 hours for permissive HTN, but hesitant to hold cardizem as needed for rate control. Will hold, but have a low threshold to re start if rate is not controlled.     Neck pain on  right side X 2 day, cervical spine xray pending. No radicular symptoms.  Lidocaine patch     Intertriginous candidiasis -hygiene with keeping dry and cool -clotrimazole ordered bid as well as nystatin powder    Chronic bronchitis (HCC) Chronic cough. Unchanged. No clinical findings to suggest exacerbation.  Continue home inhaler daily-trelegy ellipta and prn xoponex neb.  Has been encouraged to pursue outpatient PFTs.     Dyslipidemia Lipid panel pending. On 20mg  lipitor. Held this as neurology ordered lipitor 80mg . Goal LDL <70.   GERD Continue PPI    Rheumatoid arthritis with positive rheumatoid factor (HCC) -immunosuppressed. On daily 5mg  prednisone and avara. Continued this while here.     Closed fracture of single pubic ramus of pelvis, initial encounter (HCC) S/p 7 weeks ago. Doing well. Tylenol only for pain and has been in PT    Tobacco abuse  Declines tobacco patch. Conversation about risks with stroke and encouraged cessation.   Macrocytosis with no anemia -b12 checked on last hospitalization in 02/2021 and wnl Thought due to her Avara.    Body mass index is 23.31 kg/m.   Level of care: Telemetry Medical DVT prophylaxis:  Lovenox  Code Status:  Full - confirmed with patient Family Communication: son is present: Randy Ellwanger  Disposition Plan:  The patient is from: home  Anticipated d/c is to: home pending stroke work up and recommendations by therapists & requires inpatient treatment due to acute stroke and need for work up.   Consults called: neurology by EDP   Admission status:  observation    MD [facility]   How to contact the [facility] Attending or Consulting provider 7A - 7P or covering provider during after hours 7P -7A, for this patient?  Check the care team in [facility] and look for a) attending/consulting [facility] provider listed and b) the [facility] team listed Log into [email or web address] and use [place]'s universal password to access. If you do  not have the password, please contact the hospital operator. Locate the [facility] provider you are looking for under [facility] and page to a number that you can be directly reached. If you still have difficulty reaching the provider, please page the [facility] (Director on Call) for the Hospitalists listed on amion for assistance.   05/05/2021, 3:39 PM

## 2021-05-05 NOTE — ED Triage Notes (Signed)
Pt BIB [facility] as a Code Stroke LKW 5am.  Pt was up making breakfast and talking to son on phone around 9am  when she began to "not feel right".  Son noted slurred speech and came over where he noted left sided facial droop.   EMS noted L. Side facial droop, slurred speech and left sided weakness and loss of cooridination.   No blood thinners, no LOC.

## 2021-05-05 NOTE — ED Provider Notes (Signed)
[facility] EMERGENCY DEPARTMENT Provider Note   CSN: [id number] Arrival date & time: 05/05/21  1207  An emergency department physician performed an initial assessment on this suspected stroke patient at 1208.  History No chief complaint on file.   Sandra Nielsen is a 70 y.o. female.  The history is provided by the patient and medical records. No language interpreter was used.   70 year old female significant history of breast cancers, tobacco use, brought here via EMS as a code stroke.  Patient report around 9 AM this morning she was having difficulty with her speech and also was drooling.  Her son who was there voiced concern and EMS was contacted subsequently to bring her here.  She feels her symptom is somewhat improving.  She denies any associated headache, vision changes, diplopia, chest pain, trouble breathing, abdominal pain, back pain, focal numbness or focal weakness.  She denies having any prior stroke.  She had a hip injury several weeks ago and currently undergoing physical therapy.  She is currently not on any blood thinner medication.  She denies any recent medication changes.  Past Medical History:  Diagnosis Date   Female cystocele    History of breast cancer    S/P RIGHT MASTECTOMY / NO CHEMORADIATION/   NO RECURRENCE   History of chronic bronchitis    Smokers' cough (HCC)    Wears glasses     Patient Active Problem List   Diagnosis Date Noted   Prolapse of anterior vaginal wall 09/27/2013    Past Surgical History:  Procedure Laterality Date   CYSTOCELE REPAIR N/A 09/27/2013   Procedure: BOSTON SCIENTIFIC UPHOLD LITE SACROSPINOUS LIGAMENT REPAIR , Rectocele Repair with Xenform, Cystocele repair with Xenform, Vaginal Vault Suspension, Kelly Plication;  Surgeon: Kathi Ludwig, MD;  Location: [facility];  Service: Urology;  Laterality: N/A;   LAPAROTOMY W/ BILATERAL SALPINGOOPHORECTOMY  JAN 1996   LEFT ANKLE RECONSTRUCTION   10-06-1979   MASTECTOMY WITH AXILLARY LYMPH NODE DISSECTION Right 02-16-1993   VAGINAL HYSTERECTOMY  1981     OB History   No obstetric history on file.     No family history on file.  Social History   Tobacco Use   Smoking status: Every Day    Packs/day: 1.00    Years: 40.00    Pack years: 40.00    Types: Cigarettes   Smokeless tobacco: Never  Substance Use Topics   Alcohol use: No   Drug use: No    Home Medications Prior to Admission medications   Medication Sig Start Date End Date Taking? Authorizing Provider  atorvastatin (LIPITOR) 20 MG tablet TAKE 1 TABLET BY MOUTH EACH DAY FOR CHOLESTEROL 07/01/19   [provider]  azelastine (ASTELIN) 0.1 % nasal spray Place into the nose. 12/17/18   [provider]  diltiazem (CARDIZEM CD) 120 MG 24 hr capsule Take 120 mg by mouth 2 (two) times daily. 12/10/19   [provider]  diltiazem (TIAZAC) 120 MG 24 hr capsule TAKE 1 CAPSULE BY MOUTH 2 TIMES DAILY 12/10/19   [provider]  flecainide (TAMBOCOR) 50 MG tablet Take 50 mg by mouth 2 (two) times daily. 11/16/19   [provider]  HYDROcodone-acetaminophen (NORCO/VICODIN) 5-325 MG per tablet Take 1-2 tablets by mouth every 6 (six) hours as needed for moderate pain or severe pain. 11/13/14   Presson, Mathis Fare, PA  leflunomide (ARAVA) 10 MG tablet Take 20 mg by mouth daily. 11/25/19   [provider]  levalbuterol (XOPENEX) 0.63 MG/3ML nebulizer solution Inhale into the lungs. 03/06/18   [provider]  levofloxacin (LEVAQUIN) 500 MG tablet Take 1 tablet (500 mg total) by mouth daily. 01/09/15   Charm RingsHonig, Erin J, MD  metoprolol succinate (TOPROL-XL) 25 MG 24 hr tablet Take 25 mg by mouth daily. 11/25/19   [provider]  mupirocin cream (BACTROBAN) 2 % Apply 1 application topically 2 (two) times daily. 12/24/19   Vivi BarrackWagoner, Matthew R, DPM  omeprazole (PRILOSEC) 40 MG capsule Take 40 mg by mouth at bedtime. 07/01/19    [provider]  oxyCODONE-acetaminophen (ROXICET) 5-325 MG per tablet Take 1 tablet by mouth every 4 (four) hours as needed for severe pain. 09/27/13   Jethro Bolusannenbaum, Sigmund, MD  pantoprazole (PROTONIX) 40 MG tablet Take 40 mg by mouth daily. 12/17/19   [provider]  Polyethylene Glycol 3350 (MIRALAX PO) Take by mouth daily.    [provider]  predniSONE (DELTASONE) 50 MG tablet Take 1 pill daily for 5 days. 01/09/15   Charm RingsHonig, Erin J, MD  TRELEGY ELLIPTA 100-62.5-25 MCG/INH AEPB Inhale 1 puff into the lungs daily. 12/17/19   [provider]  valACYclovir (VALTREX) 1000 MG tablet Take 1 tablet (1,000 mg total) by mouth 3 (three) times daily. X 7 days 11/13/14   Ria ClockPresson, Jennifer Lee H, PA    Allergies    Meloxicam, Penicillins, and Levofloxacin  Review of Systems   Review of Systems  All other systems reviewed and are negative.  Physical Exam Updated Vital Signs BP 111/64 (BP Location: Right Arm)   Pulse 82   Temp 98.2 F (36.8 C) (Oral)   Resp (!) 21   Wt 71.6 kg   SpO2 98%   BMI 23.31 kg/m   Physical Exam Vitals and nursing note reviewed.  Constitutional:      General: She is not in acute distress.    Appearance: She is well-developed.  HENT:     Head: Atraumatic.  Eyes:     Conjunctiva/sclera: Conjunctivae normal.  Cardiovascular:     Rate and Rhythm: Normal rate and regular rhythm.     Pulses: Normal pulses.     Heart sounds: Normal heart sounds.  Pulmonary:     Effort: Pulmonary effort is normal.  Abdominal:     Palpations: Abdomen is soft.  Musculoskeletal:     Cervical back: Neck supple.  Skin:    Findings: No rash.  Neurological:     Mental Status: She is alert.     Comments: Neurologic exam:  Speech mildly dysarthric, pupils equal round reactive to light, extraocular movements intact  Normal peripheral visual fields Cranial nerves III through XII normal including no facial droop Follows commands, moves all extremities x4,  normal strength to bilateral upper and lower extremities at all major muscle groups including grip Sensation normal to light touch and pinprick Coordination intact, no limb ataxia, finger-nose-finger normal Rapid alternating movements normal No pronator drift Gait not tested  Psychiatric:        Mood and Affect: Mood normal.    ED Results / Procedures / Treatments   Labs (all labs ordered are listed, but only abnormal results are displayed) Labs Reviewed  CBC - Abnormal; Notable for the following components:      Result Value   RBC 3.49 (*)    MCV 108.9 (*)    MCH 36.1 (*)    RDW 16.0 (*)    All other components within normal limits  DIFFERENTIAL - Abnormal; Notable  for the following components:   Neutro Abs 8.1 (*)    All other components within normal limits  COMPREHENSIVE METABOLIC PANEL - Abnormal; Notable for the following components:   Glucose, Bld 172 (*)    Albumin 3.3 (*)    Alkaline Phosphatase 132 (*)    All other components within normal limits  CBG MONITORING, ED - Abnormal; Notable for the following components:   Glucose-Capillary 205 (*)    All other components within normal limits  I-STAT CHEM 8, ED - Abnormal; Notable for the following components:   Glucose, Bld 168 (*)    Calcium, Ion 1.08 (*)    All other components within normal limits  PROTIME-INR  APTT  LDL CHOLESTEROL, DIRECT  HEMOGLOBIN A1C  CBG MONITORING, ED    EKG None  Date: 05/05/2021  Rate: 82  Rhythm: normal sinus rhythm  QRS Axis: normal  Intervals: normal  ST/T Wave abnormalities: normal  Conduction Disutrbances: none  Narrative Interpretation: probable old anteroseptal infarct  Old EKG Reviewed: No significant changes noted EKG reviewed and interpreted by me    Radiology CT HEAD CODE STROKE WO CONTRAST  Result Date: 05/05/2021 CLINICAL DATA:  Code stroke. Neuro deficit, acute, stroke suspected. Left facial droop and slurred speech. EXAM: CT HEAD WITHOUT CONTRAST TECHNIQUE:  Contiguous axial images were obtained from the base of the skull through the vertex without intravenous contrast. COMPARISON:  None. FINDINGS: Brain: There is no evidence of an acute infarct, intracranial hemorrhage, mass, midline shift, or extra-axial fluid collection. Hypodensities in the cerebral white matter bilaterally are nonspecific but compatible with mild chronic small vessel ischemic disease. Mild cerebral atrophy is within normal limits for age. Vascular: Calcified atherosclerosis at the skull base. No hyperdense vessel. Skull: No fracture or suspicious osseous lesion. Sinuses/Orbits: Moderate mucosal thickening in the left maxillary sinus. Clear mastoid air cells. Unremarkable orbits. Other: None. ASPECTS [facility] Stroke Program Early CT Score) - Ganglionic level infarction (caudate, lentiform nuclei, internal capsule, insula, M1-M3 cortex): 7 - Supraganglionic infarction (M4-M6 cortex): 3 Total score (0-10 with 10 being normal): 10 IMPRESSION: 1. No evidence of acute intracranial abnormality. 2. ASPECTS is 10. 3. Mild chronic small vessel ischemic disease. These results were communicated to Dr. Selina Cooley at 12:27 pm on 05/05/2021 by text page via the [facility] messaging system. Electronically Signed   By: Sebastian Ache M.D.   On: 05/05/2021 12:27    Procedures .Critical Care  Date/Time: 05/05/2021 2:05 PM Performed by: Fayrene Helper, PA-C Authorized by: Fayrene Helper, PA-C   Critical care provider statement:    Critical care time (minutes):  30   Critical care was time spent personally by me on the following activities:  Discussions with consultants, evaluation of patient's response to treatment, examination of patient, ordering and performing treatments and interventions, ordering and review of laboratory studies, ordering and review of radiographic studies, pulse oximetry, re-evaluation of patient's condition, obtaining history from patient or surrogate and review of old charts   Medications Ordered in  ED Medications  sodium chloride flush (NS) 0.9 % injection 3 mL (has no administration in time range)  clopidogrel (PLAVIX) tablet 75 mg (has no administration in time range)  atorvastatin (LIPITOR) tablet 80 mg (has no administration in time range)    ED Course  I have reviewed the triage vital signs and the nursing notes.  Pertinent labs & imaging results that were available during my care of the patient were reviewed by me and considered in my medical decision making (see chart  for details).    MDM Rules/Calculators/A&P                           BP 113/65   Pulse 77   Temp 98.2 F (36.8 C) (Oral)   Resp (!) 21   Wt 71.6 kg   SpO2 96%   BMI 23.31 kg/m   Final Clinical Impression(s) / ED Diagnoses Final diagnoses:  Dysarthria  Acute left-sided weakness    Rx / DC Orders ED Discharge Orders     None      Patient developed dysarthria and some drooling at approximately 9 AM this morning, brought in with code stroke activated.  A prompt evaluation by neurologist.  Initial head CT scan unremarkable.  Appreciate consultation from [facility] hospitalist Dr. Artis Flock who agrees to admit patient for additional stroke work-up.  Care discussed with Dr. Watt Climes, Greta Doom, PA-C 05/05/21 1407    Arby Barrette, MD 05/10/21 [phone]

## 2021-05-05 NOTE — Consult Note (Signed)
NEUROLOGY CONSULTATION NOTE   Date of service: May 05, 2021 Patient Name: Sandra Nielsen MRN:  [id number] DOB:  Nov 21, 1950 Reason for consult: stroke code for L facial droop and mild L weakness Requesting physician: Arby Barrette MD _ _ _   _ __   _ __ _ _  __ __   _ __   __ _  History of Present Illness   This is a patient with a history of SVT on flecainide, breast cancer status post right mastectomy with no recurrence, history of chronic bronchitis who was brought in by EMS as a stroke code for acute onset left-sided weakness.  Last known well was 5 AM this morning.  At 9 AM she called her son to state that she was not feeling well.  He noticed weakness of her left face, arm, leg, dysarthria, and drooling and called 911.  She has no prior history of stroke.  She is not on any antiplatelet or anticoagulation.  She has an allergy to meloxicam.  NIH stroke scale was 5.  CT head showed no acute intracranial process.  tPA was not administered because patient was outside the window.  CTA was not performed because exam was not consistent with LVO.  Patient admitted to hospitalist service for stroke work-up.   ROS   Per HPI; all other systems reviewed and were negative  Past History   Past Medical History:  Diagnosis Date   Female cystocele    History of breast cancer    S/P RIGHT MASTECTOMY / NO CHEMORADIATION/   NO RECURRENCE   History of chronic bronchitis    Smokers' cough (HCC)    Wears glasses    Past Surgical History:  Procedure Laterality Date   CYSTOCELE REPAIR N/A 09/27/2013   Procedure: BOSTON SCIENTIFIC UPHOLD LITE SACROSPINOUS LIGAMENT REPAIR , Rectocele Repair with Xenform, Cystocele repair with Xenform, Vaginal Vault Suspension, Joelene Millin;  Surgeon: Kathi Ludwig, MD;  Location: [facility];  Service: Urology;  Laterality: N/A;   LAPAROTOMY W/ BILATERAL SALPINGOOPHORECTOMY  JAN 1996   LEFT ANKLE RECONSTRUCTION  10-06-1979   MASTECTOMY WITH  AXILLARY LYMPH NODE DISSECTION Right 02-16-1993   VAGINAL HYSTERECTOMY  1981   No family history on file. Social History   Socioeconomic History   Marital status: Widowed    Spouse name: Not on file   Number of children: Not on file   Years of education: Not on file   Highest education level: Not on file  Occupational History   Not on file  Tobacco Use   Smoking status: Every Day    Packs/day: 1.00    Years: 40.00    Pack years: 40.00    Types: Cigarettes   Smokeless tobacco: Never  Substance and Sexual Activity   Alcohol use: No   Drug use: No   Sexual activity: Not on file  Other Topics Concern   Not on file  Social History Narrative   Not on file   Social Determinants of Health   Financial Resource Strain: Not on file  Food Insecurity: Not on file  Transportation Needs: Not on file  Physical Activity: Not on file  Stress: Not on file  Social Connections: Not on file   Allergies  Allergen Reactions   Meloxicam Swelling    Other reaction(s): Swelling    Penicillins Hives and Swelling   Levofloxacin Palpitations    Medications    Current Facility-Administered Medications:    atorvastatin (LIPITOR) tablet 80 mg, 80 mg,  Oral, Daily, Jefferson Fuel, MD   clopidogrel (PLAVIX) tablet 75 mg, 75 mg, Oral, Daily, Jefferson Fuel, MD   sodium chloride flush (NS) 0.9 % injection 3 mL, 3 mL, Intravenous, Once, Arby Barrette, MD  Current Outpatient Medications:    atorvastatin (LIPITOR) 20 MG tablet, TAKE 1 TABLET BY MOUTH EACH DAY FOR CHOLESTEROL, Disp: , Rfl:    azelastine (ASTELIN) 0.1 % nasal spray, Place into the nose., Disp: , Rfl:    diltiazem (CARDIZEM CD) 120 MG 24 hr capsule, Take 120 mg by mouth 2 (two) times daily., Disp: , Rfl:    diltiazem (TIAZAC) 120 MG 24 hr capsule, TAKE 1 CAPSULE BY MOUTH 2 TIMES DAILY, Disp: , Rfl:    flecainide (TAMBOCOR) 50 MG tablet, Take 50 mg by mouth 2 (two) times daily., Disp: , Rfl:    HYDROcodone-acetaminophen  (NORCO/VICODIN) 5-325 MG per tablet, Take 1-2 tablets by mouth every 6 (six) hours as needed for moderate pain or severe pain., Disp: 20 tablet, Rfl: 0   leflunomide (ARAVA) 10 MG tablet, Take 20 mg by mouth daily., Disp: , Rfl:    levalbuterol (XOPENEX) 0.63 MG/3ML nebulizer solution, Inhale into the lungs., Disp: , Rfl:    levofloxacin (LEVAQUIN) 500 MG tablet, Take 1 tablet (500 mg total) by mouth daily., Disp: 7 tablet, Rfl: 0   metoprolol succinate (TOPROL-XL) 25 MG 24 hr tablet, Take 25 mg by mouth daily., Disp: , Rfl:    mupirocin cream (BACTROBAN) 2 %, Apply 1 application topically 2 (two) times daily., Disp: 15 g, Rfl: 0   omeprazole (PRILOSEC) 40 MG capsule, Take 40 mg by mouth at bedtime., Disp: , Rfl:    oxyCODONE-acetaminophen (ROXICET) 5-325 MG per tablet, Take 1 tablet by mouth every 4 (four) hours as needed for severe pain., Disp: 30 tablet, Rfl: 0   pantoprazole (PROTONIX) 40 MG tablet, Take 40 mg by mouth daily., Disp: , Rfl:    Polyethylene Glycol 3350 (MIRALAX PO), Take by mouth daily., Disp: , Rfl:    predniSONE (DELTASONE) 50 MG tablet, Take 1 pill daily for 5 days., Disp: 5 tablet, Rfl: 0   TRELEGY ELLIPTA 100-62.5-25 MCG/INH AEPB, Inhale 1 puff into the lungs daily., Disp: , Rfl:    valACYclovir (VALTREX) 1000 MG tablet, Take 1 tablet (1,000 mg total) by mouth 3 (three) times daily. X 7 days, Disp: 21 tablet, Rfl: 0     Vitals   Vitals:   05/05/21 1230 05/05/21 1235 05/05/21 1245  BP: 111/64 111/64 122/62  Pulse: 81 82 84  Resp: 17 (!) 21 (!) 21  Temp:  98.2 F (36.8 C)   TempSrc:  Oral   SpO2: 98% 98% 97%  Weight:  71.6 kg      Body mass index is 23.31 kg/m.  Physical Exam   Physical Exam Gen: A&O x4, NAD HEENT: Atraumatic, normocephalic;mucous membranes moist; oropharynx clear, tongue without atrophy or fasciculations. Neck: Supple, trachea midline. Resp: CTAB, no w/r/r CV: RRR, no m/g/r; nml S1 and S2. 2+ symmetric peripheral pulses. Abd:  soft/NT/ND; nabs x 4 quad Extrem: Nml bulk; no cyanosis, clubbing, or edema.  Neuro: *MS: A&O x4. Follows multi-step commands.  *Speech: fluid, mildly dysarthric, able to name and repeat *CN:    I: Deferred   II,III: PERRLA, VFF by confrontation, optic discs not visualized 2/2 pupillary constriction   III,IV,VI: EOMI w/o nystagmus, no ptosis   V: Sensation intact from V1 to V3 to LT   VII: Eyelid closure was full.  L UMN facial droop   VIII: Hearing intact to voice   IX,X: Voice normal, palate elevates symmetrically    XI: SCM/trap 5/5 bilat   XII: Tongue protrudes midline, no atrophy or fasciculations   *Motor:   Normal bulk.  No tremor, rigidity or bradykinesia. No pronator drift.    Strength: Dlt Bic Tri WrE WrF FgS Gr HF KnF KnE PlF DoF    Left 4+ 5 4 4+ 5 4 5 4 5 5 5 5     Right 5 5 5 5 5 5 5 5 5 5 5 5    *Sensory: Intact to light touch, pinprick, temperature vibration throughout. Symmetric. Propioception intact bilat.  No double-simultaneous extinction.  *Coordination:  Finger-to-nose, heel-to-shin, rapid alternating motions were intact. *Reflexes:  2+ and symmetric throughout without clonus; toes down-going bilat *Gait:  deferred  Premorbid mRS = 2   Labs   CBC:  Recent Labs  Lab 05/05/21 1211 05/05/21 1219  WBC 9.9  --   NEUTROABS 8.1*  --   HGB 12.6 13.3  HCT 38.0 39.0  MCV 108.9*  --   PLT 293  --     Basic Metabolic Panel:  Lab Results  Component Value Date   NA 135 05/05/2021   K 3.9 05/05/2021   CO2 26 05/05/2021   GLUCOSE 168 (H) 05/05/2021   BUN 11 05/05/2021   CREATININE 0.70 05/05/2021   CALCIUM 9.7 05/05/2021   GFRNONAA >60 05/05/2021   Lipid Panel: No results found for: LDLCALC HgbA1c: No results found for: HGBA1C Urine Drug Screen: No results found for: LABOPIA, COCAINSCRNUR, LABBENZ, AMPHETMU, THCU, LABBARB  Alcohol Level No results found for: [facility]   Impression   This is a patient with a history of SVT on flecainide, breast cancer  status post right mastectomy with no recurrence, history of chronic bronchitis who was brought in by EMS as a stroke code for acute onset left-sided weakness.  She was outside the window for tPA.  Recommendations   - Admit to hospitalist service for stroke w/u; stroke team will consult - Permissive HTN x48 hrs from sx onset or until stroke ruled out by MRI goal BP <220/110. PRN labetalol or hydralazine if BP above these parameters. Avoid oral antihypertensives. - MRI brain wo contrast - MRA H&N - TTE  - Check A1c and LDL + add statin per guidelines - Plavix 75mg  daily. ASA contraindicated in setting of allergy to meloxicam - q4 hr neuro checks - STAT head CT for any change in neuro exam - Tele - PT/OT/SLP - Stroke education - Amb referral to neurology upon discharge   Stroke team will continue to follow.   ______________________________________________________________________   Thank you for the opportunity to take part in the care of this patient. If you have any further questions, please contact the neurology consultation attending.  Signed,  05/07/2021, MD [facility] [phone]  If 7pm- 7am, please page neurology on call as listed in AMION.

## 2021-05-05 NOTE — Code Documentation (Signed)
Stroke Response Nurse Documentation Code Documentation  Laykin Rainone is a 70 y.o. female arriving to [facility] via [facility] on 05/05/2021 with past medical hx of breast cancer s/p right mastectomy. Code stroke was activated by EMS. Patient from home where she was LKW at 0500 when she woke up and performed her usually daily habits. At 0900 she was on the phone with her son and complained of not feeling well. Upon her sons arrival he noted her to have a left facial droop. On No antithrombotic. Stroke team at the bedside on patient arrival. Labs drawn and patient cleared for CT by Dr. Clarice Pole. Patient to CT with team. NIHSS 5, see documentation for details and code stroke times. Patient with left facial droop, left arm weakness, left leg weakness, and dysarthria  on exam. The following imaging was completed: CT. Patient is not a candidate for tPA due to LKW 0500.  Care/Plan: q2h NIHSS and VS Bedside handoff with ED RN Josh.    Arjan Strohm L Tahara Ruffini  Rapid Response RN

## 2021-05-06 ENCOUNTER — Encounter (HOSPITAL_COMMUNITY): Payer: Self-pay | Admitting: Internal Medicine

## 2021-05-06 ENCOUNTER — Observation Stay (HOSPITAL_COMMUNITY): Payer: Medicare Other

## 2021-05-06 ENCOUNTER — Inpatient Hospital Stay (HOSPITAL_COMMUNITY): Payer: Medicare Other

## 2021-05-06 DIAGNOSIS — R29705 NIHSS score 5: Secondary | ICD-10-CM | POA: Diagnosis present

## 2021-05-06 DIAGNOSIS — R29810 Facial weakness: Secondary | ICD-10-CM | POA: Diagnosis present

## 2021-05-06 DIAGNOSIS — B372 Candidiasis of skin and nail: Secondary | ICD-10-CM | POA: Diagnosis present

## 2021-05-06 DIAGNOSIS — I10 Essential (primary) hypertension: Secondary | ICD-10-CM | POA: Diagnosis present

## 2021-05-06 DIAGNOSIS — Z20822 Contact with and (suspected) exposure to covid-19: Secondary | ICD-10-CM | POA: Diagnosis present

## 2021-05-06 DIAGNOSIS — Z888 Allergy status to other drugs, medicaments and biological substances status: Secondary | ICD-10-CM | POA: Diagnosis not present

## 2021-05-06 DIAGNOSIS — I6389 Other cerebral infarction: Secondary | ICD-10-CM | POA: Diagnosis not present

## 2021-05-06 DIAGNOSIS — I4891 Unspecified atrial fibrillation: Secondary | ICD-10-CM | POA: Diagnosis present

## 2021-05-06 DIAGNOSIS — Z720 Tobacco use: Secondary | ICD-10-CM | POA: Diagnosis not present

## 2021-05-06 DIAGNOSIS — G8194 Hemiplegia, unspecified affecting left nondominant side: Secondary | ICD-10-CM | POA: Diagnosis present

## 2021-05-06 DIAGNOSIS — R471 Dysarthria and anarthria: Secondary | ICD-10-CM | POA: Diagnosis present

## 2021-05-06 DIAGNOSIS — Z853 Personal history of malignant neoplasm of breast: Secondary | ICD-10-CM | POA: Diagnosis not present

## 2021-05-06 DIAGNOSIS — I63411 Cerebral infarction due to embolism of right middle cerebral artery: Secondary | ICD-10-CM

## 2021-05-06 DIAGNOSIS — K219 Gastro-esophageal reflux disease without esophagitis: Secondary | ICD-10-CM | POA: Diagnosis present

## 2021-05-06 DIAGNOSIS — I639 Cerebral infarction, unspecified: Secondary | ICD-10-CM

## 2021-05-06 DIAGNOSIS — Z79899 Other long term (current) drug therapy: Secondary | ICD-10-CM | POA: Diagnosis not present

## 2021-05-06 DIAGNOSIS — J449 Chronic obstructive pulmonary disease, unspecified: Secondary | ICD-10-CM | POA: Diagnosis present

## 2021-05-06 DIAGNOSIS — M059 Rheumatoid arthritis with rheumatoid factor, unspecified: Secondary | ICD-10-CM | POA: Diagnosis present

## 2021-05-06 DIAGNOSIS — R531 Weakness: Secondary | ICD-10-CM | POA: Diagnosis present

## 2021-05-06 DIAGNOSIS — Z8616 Personal history of COVID-19: Secondary | ICD-10-CM | POA: Diagnosis not present

## 2021-05-06 DIAGNOSIS — Z880 Allergy status to penicillin: Secondary | ICD-10-CM | POA: Diagnosis not present

## 2021-05-06 DIAGNOSIS — Z7952 Long term (current) use of systemic steroids: Secondary | ICD-10-CM | POA: Diagnosis not present

## 2021-05-06 DIAGNOSIS — E785 Hyperlipidemia, unspecified: Secondary | ICD-10-CM | POA: Diagnosis present

## 2021-05-06 DIAGNOSIS — I471 Supraventricular tachycardia: Secondary | ICD-10-CM | POA: Diagnosis not present

## 2021-05-06 DIAGNOSIS — F17210 Nicotine dependence, cigarettes, uncomplicated: Secondary | ICD-10-CM | POA: Diagnosis present

## 2021-05-06 LAB — ECHOCARDIOGRAM COMPLETE BUBBLE STUDY
AR max vel: 2.23 cm2
AV Area VTI: 2.25 cm2
AV Area mean vel: 2.18 cm2
AV Mean grad: 4 mmHg
AV Peak grad: 8 mmHg
Ao pk vel: 1.41 m/s
Area-P 1/2: 4.39 cm2
S' Lateral: 2.9 cm

## 2021-05-06 LAB — RAPID URINE DRUG SCREEN, HOSP PERFORMED
Amphetamines: NOT DETECTED
Barbiturates: NOT DETECTED
Benzodiazepines: NOT DETECTED
Cocaine: NOT DETECTED
Opiates: NOT DETECTED
Tetrahydrocannabinol: NOT DETECTED

## 2021-05-06 MED ORDER — DILTIAZEM HCL-DEXTROSE 125-5 MG/125ML-% IV SOLN (PREMIX)
5.0000 mg/h | INTRAVENOUS | Status: DC
  Administered 2021-05-06: 5 mg/h via INTRAVENOUS
  Filled 2021-05-06 (×2): qty 125

## 2021-05-06 MED ORDER — DILTIAZEM HCL 25 MG/5ML IV SOLN
15.0000 mg | Freq: Once | INTRAVENOUS | Status: AC
  Administered 2021-05-06: 15 mg via INTRAVENOUS
  Filled 2021-05-06: qty 5

## 2021-05-06 MED ORDER — METOPROLOL SUCCINATE ER 25 MG PO TB24
25.0000 mg | ORAL_TABLET | Freq: Every day | ORAL | Status: DC
  Administered 2021-05-06 – 2021-05-08 (×3): 25 mg via ORAL
  Filled 2021-05-06 (×3): qty 1

## 2021-05-06 MED ORDER — DILTIAZEM LOAD VIA INFUSION: 20.0000 mg | Freq: Once | INTRAVENOUS | Status: DC

## 2021-05-06 MED ORDER — ATORVASTATIN CALCIUM 10 MG PO TABS
20.0000 mg | ORAL_TABLET | Freq: Every day | ORAL | Status: DC
  Administered 2021-05-07 – 2021-05-09 (×3): 20 mg via ORAL
  Filled 2021-05-06 (×3): qty 2

## 2021-05-06 MED ORDER — DILTIAZEM HCL 25 MG/5ML IV SOLN
20.0000 mg | Freq: Once | INTRAVENOUS | Status: AC
  Administered 2021-05-06: 20 mg via INTRAVENOUS
  Filled 2021-05-06: qty 5

## 2021-05-06 MED ORDER — DILTIAZEM HCL ER COATED BEADS 120 MG PO CP24
120.0000 mg | ORAL_CAPSULE | Freq: Two times a day (BID) | ORAL | Status: DC
  Filled 2021-05-06: qty 1

## 2021-05-06 MED ORDER — ASPIRIN EC 81 MG PO TBEC
81.0000 mg | DELAYED_RELEASE_TABLET | Freq: Every day | ORAL | Status: DC
  Administered 2021-05-06 – 2021-05-09 (×4): 81 mg via ORAL
  Filled 2021-05-06 (×4): qty 1

## 2021-05-06 MED ORDER — SODIUM CHLORIDE 0.9 % IV BOLUS
1000.0000 mL | Freq: Once | INTRAVENOUS | Status: AC
  Administered 2021-05-06: 1000 mL via INTRAVENOUS

## 2021-05-06 NOTE — Progress Notes (Signed)
BLE venous duplex has been completed.  Results can be found under chart review under CV PROC. 05/06/2021 4:06 PM Lucien Budney RVT, RDMS

## 2021-05-06 NOTE — Progress Notes (Signed)
STROKE TEAM PROGRESS NOTE   INTERVAL HISTORY Her daughter and daughter's boyfriend are at the bedside.  Patient sitting in chair, eating lunch.  Per daughter, patient yesterday had left facial droop and slurred speech but the day symptoms symptoms resolved.  Patient denies any arm or leg weakness.  MRI showed right insular cortex small infarct.  Patient has history of SVT on flecainide, Cardizem and metoprolol, seems controlled well.  Follow with cardiology Dr. Sampson Goon at [facility].  However, this morning patient had SVT with heart rate up to 200s.  Started on Cardizem IV and currently heart rate 80s.  Discussed about loop recorder, she preferred to be done with Dr. Sampson Goon if able.  Discussed with our EP.  OBJECTIVE Vitals:   05/06/21 0920 05/06/21 1032 05/06/21 1035 05/06/21 1039  BP: 116/68 (!) 91/51 121/69 (!) 115/54  Pulse: 94 (!) 188 90 86  Resp: 18     Temp:      TempSrc:      SpO2: 100% 98% 98% 98%  Weight:      Height:        CBC:  Recent Labs  Lab 05/05/21 1211 05/05/21 1219  WBC 9.9  --   NEUTROABS 8.1*  --   HGB 12.6 13.3  HCT 38.0 39.0  MCV 108.9*  --   PLT 293  --     Basic Metabolic Panel:  Recent Labs  Lab 05/05/21 1211 05/05/21 1219  NA 135 135  K 3.6 3.9  CL 98 98  CO2 26  --   GLUCOSE 172* 168*  BUN 10 11  CREATININE 0.90 0.70  CALCIUM 9.7  --     Lipid Panel: No results found for: CHOL, TRIG, HDL, CHOLHDL, VLDL, LDLCALC HgbA1c: No results found for: HGBA1C Urine Drug Screen: No results found for: LABOPIA, COCAINSCRNUR, LABBENZ, AMPHETMU, THCU, LABBARB  Alcohol Level No results found for: [facility]  IMAGING  DG Cervical Spine 2 or 3 views  Result Date: 05/05/2021 CLINICAL DATA:  Neck pain on the right side. EXAM: CERVICAL SPINE - 2-3 VIEW COMPARISON:  None. FINDINGS: There is no evidence of cervical spine fracture or prevertebral soft tissue swelling. 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 There are moderate to severe degenerative  changes from C3-4 to C6-7. There is an anterior wedge deformity of the C4 vertebral body with less than 25% height loss which appears chronic. 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 No acute osseous injury. Electronically Signed   By: Romona Curls M.D.   On: 05/05/2021 16:19   MR ANGIO HEAD WO CONTRAST  Result Date: 05/05/2021 CLINICAL DATA:  Neuro deficit, acute, stroke suspected. Slurred speech. EXAM: MRI HEAD WITHOUT CONTRAST MRA HEAD WITHOUT CONTRAST MRA OF THE NECK WITHOUT AND WITH CONTRAST TECHNIQUE: Multiplanar, multi-echo pulse sequences of the brain and surrounding structures were acquired without intravenous contrast. Angiographic images of the Circle of Willis were acquired using MRA technique without intravenous contrast. Angiographic images of the neck were acquired using MRA technique without and with intravenous contrast. Carotid stenosis measurements (when applicable) are obtained utilizing NASCET criteria, using the distal internal carotid diameter as the denominator. CONTRAST:  7.15mL GADAVIST GADOBUTROL 1 MMOL/ML IV SOLN COMPARISON:  Head CT 05/05/2020 FINDINGS: MR HEAD FINDINGS Brain: There is a small acute infarct involving the right insula. Patchy T2 hyperintensities in the cerebral white matter and pons are nonspecific but compatible with moderate chronic small vessel ischemic disease. Mild cerebral atrophy is within normal limits  for age. No intracranial hemorrhage, mass, midline shift, or extra-axial fluid collection is identified. Vascular: Major intracranial vascular flow voids are preserved. Skull and upper cervical spine: Unremarkable bone marrow signal. Sinuses/Orbits: Unremarkable orbits. Moderate mucosal thickening in the left maxillary sinus. Small bilateral mastoid effusions. Other: None. MRA HEAD FINDINGS Anterior circulation: The internal carotid arteries are widely patent from skull base to carotid termini. The ACAs and MCAs are patent  without evidence of a proximal branch occlusion or significant proximal stenosis. No aneurysm is identified. Posterior circulation: The included intracranial portions of the vertebral arteries are patent to the basilar with the left being strongly dominant. The basilar artery is widely patent. There are moderate-sized posterior communicating arteries bilaterally. Both PCAs are patent without evidence of a significant proximal stenosis. No aneurysm is identified. Anatomic variants: Duplicated middle cerebral arteries bilaterally. Hypoplastic right vertebral artery. MRA NECK FINDINGS The study is mildly motion degraded. Aortic arch: Normal variant 4 vessel aortic arch with the left vertebral artery arising from the arch. Widely patent arch vessel origins. Right carotid system: Patent without evidence of a significant stenosis or dissection. Left carotid system: Patent without evidence of a significant stenosis or dissection. Vertebral arteries: The vertebral arteries are patent with antegrade flow bilaterally and with the left being dominant. Motion artifact limits assessment of the vertebral artery origins, however there is no evidence of a significant stenosis or dissection on either side more distally. IMPRESSION: 1. ectronically Signed   By: Sebastian Ache M.D.   On: 05/05/2021 15:11   MR ANGIO NECK W WO CONTRAST  Result Date: 05/05/2021 CLINICAL DATA:  Neuro deficit, acute, stroke suspected. Slurred speech. EXAM: MRI HEAD WITHOUT CONTRAST MRA HEAD WITHOUT CONTRAST MRA OF THE NECK WITHOUT AND WITH CONTRAST TECHNIQUE: Multiplanar, multi-echo pulse sequences of the brain and surrounding structures were acquired without intravenous contrast. Angiographic images of the Circle of Willis were acquired using MRA technique without intravenous contrast. Angiographic images of the neck were acquired using MRA technique without and with intravenous contrast. Carotid stenosis measurements (when applicable) are obtained  utilizing NASCET criteria, using the distal internal carotid diameter as the denominator. CONTRAST:  7.36mL GADAVIST GADOBUTROL 1 MMOL/ML IV SOLN COMPARISON:  Head CT 05/05/2020 FINDINGS: MR HEAD FINDINGS Brain: There is a small acute infarct involving the right insula. Patchy T2 hyperintensities in the cerebral white matter and pons are nonspecific but compatible with moderate chronic small vessel ischemic disease. Mild cerebral atrophy is within normal limits for age. No intracranial hemorrhage, mass, midline shift, or extra-axial fluid collection is identified. Vascular: Major intracranial vascular flow voids are preserved. Skull and upper cervical spine: Unremarkable bone marrow signal. Sinuses/Orbits: Unremarkable orbits. Moderate mucosal thickening in the left maxillary sinus. Small bilateral mastoid effusions. Other: None. MRA HEAD FINDINGS Anterior circulation: The internal carotid arteries are widely patent from skull base to carotid termini. The ACAs and MCAs are patent without evidence of a proximal branch occlusion or significant proximal stenosis. No aneurysm is identified. Posterior circulation: The included intracranial portions of the vertebral arteries are patent to the basilar with the left being strongly dominant. The basilar artery is widely patent. There are moderate-sized posterior communicating arteries bilaterally. Both PCAs are patent without evidence of a significant proximal stenosis. No aneurysm is identified. Anatomic variants: Duplicated middle cerebral arteries bilaterally. Hypoplastic right vertebral artery. MRA NECK FINDINGS The study is mildly motion degraded. Aortic arch: Normal variant 4 vessel aortic arch with the left vertebral artery arising from the arch. Widely patent arch vessel  origins. Right carotid system: Patent without evidence of a significant stenosis or dissection. Left carotid system: Patent without evidence of a significant stenosis or dissection. Vertebral  arteries: The vertebral arteries are patent with antegrade flow bilaterally and with the left being dominant. Motion artifact limits assessment of the vertebral artery origins, however there is no evidence of a significant stenosis or dissection on either side more distally. IMPRESSION: 1. 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 2. Moderate chronic small vessel ischemic disease. 3. Negative head MRA. 4. Negative neck MRA within limitations of mild motion artifact. Electronically Signed   By: Sebastian AcheAllen  Grady M.D.   On: 05/05/2021 15:11   MR BRAIN WO CONTRAST  Result Date: 05/05/2021 CLINICAL DATA:  Neuro deficit, acute, stroke suspected. Slurred speech. EXAM: MRI HEAD WITHOUT CONTRAST MRA HEAD WITHOUT CONTRAST MRA OF THE NECK WITHOUT AND WITH CONTRAST TECHNIQUE: Multiplanar, multi-echo pulse sequences of the brain and surrounding structures were acquired without intravenous contrast. Angiographic images of the Circle of Willis were acquired using MRA technique without intravenous contrast. Angiographic images of the neck were acquired using MRA technique without and with intravenous contrast. Carotid stenosis measurements (when applicable) are obtained utilizing NASCET criteria, using the distal internal carotid diameter as the denominator. CONTRAST:  7.751mL GADAVIST GADOBUTROL 1 MMOL/ML IV SOLN COMPARISON:  Head CT 05/05/2020 FINDINGS: MR HEAD FINDINGS Brain: There is a small acute infarct involving the right insula. Patchy T2 hyperintensities in the cerebral white matter and pons are nonspecific but compatible with moderate chronic small vessel ischemic disease. Mild cerebral atrophy is within normal limits for age. No intracranial hemorrhage, mass, midline shift, or extra-axial fluid collection is identified. Vascular: Major intracranial vascular flow voids are preserved. Skull and upper cervical spine: Unremarkable bone marrow signal. Sinuses/Orbits: Unremarkable orbits. Moderate mucosal thickening in the  left maxillary sinus. Small bilateral mastoid effusions. Other: None. MRA HEAD FINDINGS Anterior circulation: The internal carotid arteries are widely patent from skull base to carotid termini. The ACAs and MCAs are patent without evidence of a proximal branch occlusion or significant proximal stenosis. No aneurysm is identified. Posterior circulation: The included intracranial portions of the vertebral arteries are patent to the basilar with the left being strongly dominant. The basilar artery is widely patent. There are moderate-sized posterior communicating arteries bilaterally. Both PCAs are patent without evidence of a significant proximal stenosis. No aneurysm is identified. Anatomic variants: Duplicated middle cerebral arteries bilaterally. Hypoplastic right vertebral artery. MRA NECK FINDINGS The study is mildly motion degraded. Aortic arch: Normal variant 4 vessel aortic arch with the left vertebral artery arising from the arch. Widely patent arch vessel origins. Right carotid system: Patent without evidence of a significant stenosis or dissection. Left carotid system: Patent without evidence of a significant stenosis or dissection. Vertebral arteries: The vertebral arteries are patent with antegrade flow bilaterally and with the left being dominant. Motion artifact limits assessment of the vertebral artery origins, however there is no evidence of a significant stenosis or dissection on either side more distally. IMPRESSION: 1. 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 2. Moderate chronic small vessel ischemic disease. 3. Negative head MRA. 4. Negative neck MRA within limitations of mild motion artifact. Electronically Signed   By: Sebastian AcheAllen  Grady M.D.   On: 05/05/2021 15:11   CT HEAD CODE STROKE WO CONTRAST  Result Date: 05/05/2021 CLINICAL DATA:  Code stroke. Neuro deficit, acute, stroke suspected. Left facial droop and slurred speech. EXAM: CT HEAD WITHOUT CONTRAST TECHNIQUE: Contiguous axial  images were obtained  from the base of the skull through the vertex without intravenous contrast. COMPARISON:  None. FINDINGS: Brain: There is no evidence of an acute infarct, intracranial hemorrhage, mass, midline shift, or extra-axial fluid collection. Hypodensities in the cerebral white matter bilaterally are nonspecific but compatible with mild chronic small vessel ischemic disease. Mild cerebral atrophy is within normal limits for age. Vascular: Calcified atherosclerosis at the skull base. No hyperdense vessel. Skull: No fracture or suspicious osseous lesion. Sinuses/Orbits: Moderate mucosal thickening in the left maxillary sinus. Clear mastoid air cells. Unremarkable orbits. Other: None. ASPECTS [facility] Stroke Program Early CT Score) - Ganglionic level infarction (caudate, lentiform nuclei, internal capsule, insula, M1-M3 cortex): 7 - Supraganglionic infarction (M4-M6 cortex): 3 Total score (0-10 with 10 being normal): 10 IMPRESSION: 1. No evidence of acute intracranial abnormality. 2. ASPECTS is 10. 3. Mild chronic small vessel ischemic disease. These results were communicated to Dr. Selina Cooley at 12:27 pm on 05/05/2021 by text page via the [facility] messaging system. Electronically Signed   By: Sebastian Ache M.D.   On: 05/05/2021 12:27    ECG - SR rate 99 BPM. (See cardiology reading for complete details)   PHYSICAL EXAM  Temp:  [97.8 F (36.6 C)-98.4 F (36.9 C)] 97.9 F (36.6 C) (07/24 1215) Pulse Rate:  [66-212] 81 (07/24 1215) Resp:  [14-20] 14 (07/24 1530) BP: (91-140)/(51-73) 114/54 (07/24 1530) SpO2:  [96 %-100 %] 99 % (07/24 1215)  General - Well nourished, well developed, in no apparent distress.  Ophthalmologic - fundi not visualized due to noncooperation.  Cardiovascular - Regular rhythm and rate.  Mental Status -  Level of arousal and orientation to time, place, and person were intact. Language including expression, naming, repetition, comprehension was assessed and found  intact.  Cranial Nerves II - XII - II - Visual field intact OU. III, IV, VI - Extraocular movements intact. V - Facial sensation intact bilaterally. VII - Facial movement intact bilaterally. VIII - Hearing & vestibular intact bilaterally. X - Palate elevates symmetrically. XI - Chin turning & shoulder shrug intact bilaterally. XII - Tongue protrusion intact.  Motor Strength - The patient's strength was normal in all extremities and pronator drift was absent.  Bulk was normal and fasciculations were absent.   Motor Tone - Muscle tone was assessed at the neck and appendages and was normal.  Reflexes - The patient's reflexes were symmetrical in all extremities and she had no pathological reflexes.  Sensory - Light touch, temperature/pinprick were assessed and were symmetrical.    Coordination - The patient had normal movements in the hands with no ataxia or dysmetria.  Tremor was absent.  Gait and Station - deferred.    ASSESSMENT/PLAN Ms. Dorsie Sethi is a 70 y.o. female with history of SVT on flecainide, breast cancer status post right mastectomy with no recurrence, and history of chronic bronchitis who presented with acute onset left-sided weakness, dysarthria, and drooling  She did not receive IV t-PA due to late presentation (>4.5 hours from time of onset).   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 fib as a cause of current stroke CT Head - No evidence of acute intracranial abnormality. ASPECTS is 10.  MRI head - Small acute infarct involving the right insula. Moderate chronic small vessel ischemic disease.  MRA head and Neck - Negative head and neck MRA. 2D Echo EF 60 to 65% LE venous doppler no DVT Recommend loop recorder placement.  Patient prefer her cardiologist Dr. Sampson Goon at [facility], but will discuss with our EP tomorrow. LDL - 57.3 HgbA1c - pending UDS  neg VTE prophylaxis - Lovenox No antithrombotic prior to admission, now on aspirin 81 mg daily and clopidogrel 75 mg daily DAPT for 3 weeks and then aspirin alone. Ongoing aggressive stroke risk factor management Therapy recommendations:  pending Disposition:  Pending  SVT Follow-up with Dr. Sampson Goon at [facility] On home meds of Cardizem, metoprolol and flecainide 1 episode SVT this morning with HR > 200 On IV Cardizem Resume home medication Recommend loop recorder placement.  Patient prefer her cardiologist Dr. Sampson Goon at [facility], but will discuss with our EP tomorrow.  Hypertension Home BP meds: Cardizem, Toprol  Current BP meds: Cardizem, metoprolol Mildly low at times Long-term BP goal normotensive        Hyperlipidemia Home Lipid lowering medication: Lipitor 20 mg QD LDL 57.3, goal < 70 Lipitor 20 mg daily, no need high intensity due to LDL at goal Continue statin at discharge  Tobacco abuse Current smoker Smoking cessation counseling provided Pt is willing to quit        Other Stroke Risk Factors Advanced age        Other Active Problems, Findings, Recommendations and/or Plan Code status - Full Code RA - on Arava and prednisone COPD   Hospital day # 0  I discussed with Dr. Alvino Chapel. I spent  35 minutes in total face-to-face time with the patient, more than 50% of which was spent in counseling and coordination of care, reviewing test results, images and medication, and discussing the diagnosis, treatment plan and potential prognosis. This patient's care requiresreview of multiple databases, neurological assessment, discussion with family, other specialists and medical decision making of high complexity. I had long discussion with patient and daughter at bedside, updated pt current condition, treatment plan and potential prognosis, and answered all the questions.  They expressed understanding and appreciation.   Marvel Plan, MD PhD Stroke  Neurology 05/06/2021 4:32 PM   To contact Stroke Continuity provider, please refer to [email or web address]. After hours, contact General Neurology

## 2021-05-06 NOTE — Progress Notes (Signed)
  Echocardiogram 2D Echocardiogram has been performed.  Roosvelt Maser F 05/06/2021, 1:06 PM

## 2021-05-06 NOTE — Progress Notes (Addendum)
PT Cancellation Note  Patient Details Name: Analicia Skibinski MRN: [id number] DOB: May 02, 1951   Cancelled Treatment:    Reason Eval/Treat Not Completed: Patient not medically ready. HR >200 RN calling rapid. PT will hold at this time and follow-up as time permits and as appropriate.   Raymond Gurney, PT, DPT Acute Rehabilitation Services  Pager: [phone] Office: [phone]    Jewel Baize 05/06/2021, 9:13 AM

## 2021-05-06 NOTE — Progress Notes (Signed)
OT Cancellation Note  Patient Details Name: Mike Berntsen MRN: [id number] DOB: May 09, 1951   Cancelled Treatment:    Reason Eval/Treat Not Completed: Patient not medically ready (HR >200 RN calling rapid)  Wynona Neat, OTR/L  Acute Rehabilitation Services Pager: [phone] Office: [phone] .  05/06/2021, 9:02 AM

## 2021-05-06 NOTE — Evaluation (Signed)
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   Chronic bronchitis (HCC) 07/04/2016   Tobacco abuse 11/16/2015   Past Medical History:  Past Medical History:  Diagnosis Date   Female cystocele    History of breast cancer    S/P RIGHT MASTECTOMY / NO CHEMORADIATION/   NO RECURRENCE   History of chronic bronchitis    Smokers' cough (HCC)    Wears glasses    Past Surgical History:  Past Surgical History:  Procedure Laterality Date   CYSTOCELE REPAIR N/A 09/27/2013   Procedure: BOSTON SCIENTIFIC UPHOLD LITE SACROSPINOUS LIGAMENT REPAIR , Rectocele Repair with Xenform, Cystocele repair with Xenform, Vaginal Vault Suspension, Kelly Plication;  Surgeon: Kathi Ludwig, MD;  Location: [facility];  Service: Urology;  Laterality: N/A;   LAPAROTOMY W/ BILATERAL SALPINGOOPHORECTOMY  JAN 1996   LEFT ANKLE RECONSTRUCTION  10-06-1979   MASTECTOMY WITH AXILLARY LYMPH NODE DISSECTION Right 02-16-1993   VAGINAL HYSTERECTOMY  1981   HPI:  Sandra Nielsen is a 70 y.o. 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   Assessment / Plan / Recommendation Clinical Impression  Pt was seen for a cognitive-linguistic evaluation in the  setting of a small right insula infarct.  Pt reported that she lives alone, but that her daughter is frequently with her and that she helps her with physical tasks such as bathing and that she runs errands for the patient.  Pt reported that she independently manages her medications and finances.  Pt completed the [facility] SLUMS in addition to informal evaluation measures.  She scored overall 29/30 indicating functional cognitive-linguistic abilities.  Mild short-term memory deficits were observed; however, pt reported that this was baseline and she independently repotted the memory strategies that she utilizes at home.  No further skilled ST is warranted at this time or at time of discharge.  Please re-consult if additional needs arise.  [facility] SLUMS Examination Orientation  3/3  Numeric Problem Solving  3/3  Memory  4/5  Attention 2/2  Thought Organization 3/3  Clock Drawing 4/4  Visuospatial Skills    2/2  Short Story Recall  8/8  Total  29/30 (Normal Range)    Scoring  High School Education  Less than High School Education   Normal  27-30 25-30  Mild Neurocognitive Disorder 21-26 20-24  Dementia  1-20 1-19        SLP Assessment  SLP Visit Diagnosis: Dysphagia, unspecified (R13.10)    Follow Up Recommendations  None          SLP Evaluation Cognition  Overall Cognitive Status: Within Functional Limits for tasks assessed Arousal/Alertness: Awake/alert Orientation Level: Oriented X4 Attention: Focused;Sustained Focused Attention: Appears intact Sustained Attention: Appears intact Memory: Impaired Memory Impairment: Decreased short term memory Decreased Short Term Memory: Verbal complex Awareness: Appears  intact Problem Solving: Appears intact Safety/Judgment: Appears intact       Comprehension  Auditory Comprehension Overall Auditory Comprehension: Appears within functional limits for tasks assessed    Expression Expression Primary Mode of Expression: Verbal Verbal  Expression Overall Verbal Expression: Appears within functional limits for tasks assessed Written Expression Dominant Hand: Right Written Expression: Within Functional Limits   Oral / Motor  Oral Motor/Sensory Function Overall Oral Motor/Sensory Function: Mild impairment Facial ROM: Reduced left Facial Symmetry: Within Functional Limits Facial Sensation: Within Functional Limits Lingual ROM: Within Functional Limits Lingual Symmetry: Within Functional Limits Lingual Strength: Within Functional Limits Motor Speech Overall Motor Speech: Appears within functional limits for tasks assessed   GO                   Villa Herb., M.S., CCC-SLP Acute Rehabilitation Services Office: [phone]  Shanon Rosser [facility] 05/06/2021, 10:01 AM

## 2021-05-06 NOTE — Progress Notes (Signed)
While administering patients morning medications, received a call from central tele that patients HR was 213 and in SVT. Patient at that time was sitting up in chair, A/Ox4, no complaints of SHOB, or CP. Patient did state she could feel palpitations and that "my heart is beating fast". Dr. Alvino Chapel paged and luckily was already on the floor and came to patients room. Stat EKG ordered, rapid response called, and ordered 20mg  of IV Cardizem. Once Cardizem administered, patient converted back to NSR and HR in the 90's.  Patient remained in the chair, A/0x4, asymptomatic. Will continue to monitor patients HR and vital signs throughout shift and notify MD as needed.

## 2021-05-06 NOTE — Significant Event (Addendum)
Rapid Response Event Note   Reason for Call :  SVT, HR 215 bpm  Initial Focused Assessment:  Pt sitting up in chair. No distress. Respiratory rate regular, no complaints of labored/difficulty breathing. Skin is warm, dry. Attempted valsalva maneuver and carotid massage without success.   VS: 120/65, HR 212, RR 18, SpO2 99% on room air  Interventions:  -20mg  IV Cardizem  -See pre-treatment and post- EKGs in results review  Plan of Care:  -Pt successfully converted back to NSR, HR 99 bpm, following 20 mg IV Cardizem -VS post-Cardizem:  BP 116/68, HR 94, RR 18, SpO2 100%  -RN to reevaluate VS in 1 hour, scheduled Cardizem to be given at 1000 if pt is normotensive- per MD, Dr.  Call rapid response for additional needs  Event Summary:  MD Notified: Dr. Alvino Chapel, at bedside  Call Time: 0900 Arrival Time: 0905 End Time: 0930  [id number], RN

## 2021-05-06 NOTE — Evaluation (Signed)
Clinical/Bedside Swallow Evaluation Patient Details  Name: Sandra Nielsen MRN: [id number] Date of Birth: [birth year]  Today's Date: 05/06/2021 Time: SLP Start Time (ACUTE ONLY): 0931 SLP Stop Time (ACUTE ONLY): 0943 SLP Time Calculation (min) (ACUTE ONLY): 12 min  Past Medical History:  Past Medical History:  Diagnosis Date   Female cystocele    History of breast cancer    S/P RIGHT MASTECTOMY / NO CHEMORADIATION/   NO RECURRENCE   History of chronic bronchitis    Smokers' cough (HCC)    Wears glasses    Past Surgical History:  Past Surgical History:  Procedure Laterality Date   CYSTOCELE REPAIR N/A 09/27/2013   Procedure: [employer] SCIENTIFIC UPHOLD LITE SACROSPINOUS LIGAMENT REPAIR , Rectocele Repair with Xenform, Cystocele repair with Xenform, Vaginal Vault Suspension, Kelly Plication;  Surgeon: Kathi Ludwig, MD;  Location: [facility];  Service: Urology;  Laterality: N/A;   LAPAROTOMY W/ BILATERAL SALPINGOOPHORECTOMY  JAN 1996   LEFT ANKLE RECONSTRUCTION  10-06-1979   MASTECTOMY WITH AXILLARY LYMPH NODE DISSECTION Right 02-16-1993   VAGINAL HYSTERECTOMY  1981   HPI:  Sandra Nielsen is a 70 y.o. 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   Assessment / Plan / Recommendation Clinical Impression  Pt was seen for a bedside swallow evaluation in the setting of a small right insula infarct and she presents with suspected functional oropharyngeal swallowing abilities.  Pt was encountered awake/alert and was agreeable to this evaluation.  Oral mechanism exam was remarkable for minimally reduced L labial/facial ROM otherwise it was [facility].  Pt consumed trials of thin liquid and regular solids.  Pt fed herself independently and she demonstrated good bolus  acceptance, timely mastication, suspected timely AP transport/swallow initiation, and consistent hyolaryngeal elevation/excursion to observation and palpation.  No overt s/sx of aspiration were observed with any trials.  Recommend continuation of regular solids and thin liquids with medications administered whole with liquid.  No further skilled ST is warranted at this time.  Please re-consult if additional needs arise.    SLP Visit Diagnosis: Dysphagia, unspecified (R13.10)    Aspiration Risk  No limitations    Diet Recommendation Regular;Thin liquid   Liquid Administration via: Cup;Straw Medication Administration: Whole meds with liquid Supervision: Patient able to self feed    Other  Recommendations Oral Care Recommendations: Oral care BID   Follow up Recommendations None        Swallow Study   General HPI: Meklit Cotta is a 70 y.o. 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 Type of Study: Bedside Swallow Evaluation Previous Swallow Assessment: None Diet Prior to this Study: Regular;Thin liquids Temperature Spikes Noted: No Respiratory Status: Room air History of Recent Intubation: No Behavior/Cognition: Alert;Cooperative;Pleasant mood Oral Cavity Assessment: Within Functional Limits Oral Care Completed by SLP: No Oral Cavity - Dentition: Adequate natural dentition Vision: Functional for self-feeding Self-Feeding Abilities: Able to feed self Patient Positioning: Upright in chair Baseline Vocal Quality: Normal Volitional Cough: Strong Volitional Swallow: Able to elicit    Oral/Motor/Sensory Function Overall Oral Motor/Sensory Function: Mild impairment Facial ROM: Reduced left Facial Symmetry: Within Functional Limits Facial  Sensation: Within Functional  Limits Lingual ROM: Within Functional Limits Lingual Symmetry: Within Functional Limits Lingual Strength: Within Functional Limits   Ice Chips Ice chips: Not tested   Thin Liquid Thin Liquid: Within functional limits    Nectar Thick Nectar Thick Liquid: Not tested   Honey Thick Honey Thick Liquid: Not tested   Puree Puree: Not tested   Solid     Solid: Within functional limits     Villa Herb M.S., CCC-SLP Acute Rehabilitation Services Office: [phone]  Shanon Rosser Shaquna Geigle 05/06/2021,9:52 AM

## 2021-05-06 NOTE — Progress Notes (Signed)
PROGRESS NOTE    Sandra Nielsen  WUJ:[id number] DOB: 06-Dec-1950 DOA: 05/05/2021 PCP: Shellia Cleverly, PA     Brief Narrative:  Sandra Nielsen is a 70 y.o. female with medical history significant of SVT on flecainide, breast cancer s/p right mastectomy, chronic bronchitis, RA, HLD and tobacco abuse who presented to ER with slurred speech and drooling that started around 9AM this morning.  Work-up revealed small acute infarct in the right insula.  Neurology was consulted.  New events last 24 hours / Subjective: This morning, patient went into SVT with heart rate in the 200s.  Patient was sitting in a recliner, was mentating well, without any symptoms.  Blood pressure was stable with SBP 116.  Patient was not given her metoprolol or oral Cardizem to allow for permissive hypertension in setting of stroke.  She received flecainide about 10 minutes prior to her SVT episode.  Patient was given 1 dose of IV Cardizem 20 mg with return to normal sinus rhythm.  About an hour later, patient went back into SVT with heart rate in 180s.  She was still sitting in a recliner, no change in symptoms.  SBP 90s.  Patient was given IV fluid bolus, given another IV Cardizem 15 mg with return to normal sinus rhythm with rate in the 70s.  She was continued on IV Cardizem drip.  Assessment & Plan:   Principal Problem:   Acute stroke due to ischemia [facility]) Active Problems:   Chronic bronchitis (HCC)   Closed fracture of single pubic ramus of pelvis, initial encounter (HCC)   Dyslipidemia   Frequent PVCs   Rheumatoid arthritis with positive rheumatoid factor (HCC)   Tobacco abuse   Neck pain on right side   Intertriginous candidiasis   Acute stroke -MRI brain revealed small acute infarct right insula, negative head and neck MRA -Echo pending -Lower extremity DVT pending -Stroke team following -Continue aspirin, Plavix, Lipitor -PT OT SLP  SVT -Has history of SVT, is on flecainide, Toprol, Cardizem at  home -Continue flecainide, Toprol -Started on Cardizem drip due to missing oral doses after admission and going into SVT with rate in the 200s  Rheumatoid arthritis -Continue Arava, prednisone  GERD -Continue PPI      DVT prophylaxis:  enoxaparin (LOVENOX) injection 40 mg Start: 05/05/21 1800  Code Status:     Code Status Orders  (From admission, onward)           Start     Ordered   05/05/21 1530  Full code  Continuous        05/05/21 1531           Code Status History     This patient has a current code status but no historical code status.      Family Communication: Spoke with daughter Sandra Nielsen over the phone  Disposition Plan:  Status is: Observation  The patient will require care spanning > 2 midnights and should be moved to inpatient because: Hemodynamically unstable and IV treatments appropriate due to intensity of illness or inability to take PO  Dispo: The patient is from: Home              Anticipated d/c is to: Home              Patient currently is not medically stable to d/c.   Difficult to place patient No      Consultants:  Neurology   Procedures:  None   Antimicrobials:  Anti-infectives (From admission, onward)  None        Objective: Vitals:   05/06/21 0920 05/06/21 1032 05/06/21 1035 05/06/21 1039  BP: 116/68 (!) 91/51 121/69 (!) 115/54  Pulse: 94 (!) 188 90 86  Resp: 18     Temp:      TempSrc:      SpO2: 100% 98% 98% 98%  Weight:      Height:        Intake/Output Summary (Last 24 hours) at 05/06/2021 1135 Last data filed at 05/06/2021 0847 Gross per 24 hour  Intake 557 ml  Output --  Net 557 ml   Filed Weights   05/05/21 1235  Weight: 71.6 kg    Examination:  General exam: Appears calm and comfortable  Respiratory system: Clear to auscultation. Respiratory effort normal. No respiratory distress. No conversational dyspnea.  On room air Cardiovascular system: S1 & S2 heard, regular rhythm, tachycardic rate  up to 200s during SVT episode. No murmurs. No pedal edema. Gastrointestinal system: Abdomen is nondistended, soft and nontender. Normal bowel sounds heard. Central nervous system: Alert and oriented. No focal neurological deficits. Speech clear.  Extremities: Symmetric in appearance  Skin: No rashes, lesions or ulcers on exposed skin  Psychiatry: Judgement and insight appear normal. Mood & affect appropriate.   Data Reviewed: I have personally reviewed following labs and imaging studies  CBC: Recent Labs  Lab 05/05/21 1211 05/05/21 1219  WBC 9.9  --   NEUTROABS 8.1*  --   HGB 12.6 13.3  HCT 38.0 39.0  MCV 108.9*  --   PLT 293  --    Basic Metabolic Panel: Recent Labs  Lab 05/05/21 1211 05/05/21 1219  NA 135 135  K 3.6 3.9  CL 98 98  CO2 26  --   GLUCOSE 172* 168*  BUN 10 11  CREATININE 0.90 0.70  CALCIUM 9.7  --    GFR: Estimated Creatinine Clearance: 69.4 mL/min (by C-G formula based on SCr of 0.7 mg/dL). Liver Function Tests: Recent Labs  Lab 05/05/21 1211  AST 23  ALT 19  ALKPHOS 132*  BILITOT 0.7  PROT 6.5  ALBUMIN 3.3*   No results for input(s): LIPASE, AMYLASE in the last 168 hours. No results for input(s): AMMONIA in the last 168 hours. Coagulation Profile: Recent Labs  Lab 05/05/21 1211  INR 1.0   Cardiac Enzymes: No results for input(s): CKTOTAL, CKMB, CKMBINDEX, TROPONINI in the last 168 hours. BNP (last 3 results) No results for input(s): PROBNP in the last 8760 hours. HbA1C: No results for input(s): HGBA1C in the last 72 hours. CBG: Recent Labs  Lab 05/05/21 1209  GLUCAP 205*   Lipid Profile: Recent Labs    05/05/21 1736  LDLDIRECT 57.3   Thyroid Function Tests: No results for input(s): TSH, T4TOTAL, FREET4, T3FREE, THYROIDAB in the last 72 hours. Anemia Panel: No results for input(s): VITAMINB12, FOLATE, FERRITIN, TIBC, IRON, RETICCTPCT in the last 72 hours. Sepsis Labs: No results for input(s): PROCALCITON, LATICACIDVEN in  the last 168 hours.  Recent Results (from the past 240 hour(s))  SARS CORONAVIRUS 2 (TAT 6-24 HRS) Nasopharyngeal Nasopharyngeal Swab     Status: None   Collection Time: 05/05/21  2:08 PM   Specimen: Nasopharyngeal Swab  Result Value Ref Range Status   SARS Coronavirus 2 NEGATIVE NEGATIVE Final    Comment: (NOTE) SARS-CoV-2 target nucleic acids are NOT DETECTED.  The SARS-CoV-2 RNA is generally detectable in upper and lower respiratory specimens during the acute phase of infection. Negative results do  not preclude SARS-CoV-2 infection, do not rule out co-infections with other pathogens, and should not be used as the sole basis for treatment or other patient management decisions. Negative results must be combined with clinical observations, patient history, and epidemiological information. The expected result is Negative.  Fact Sheet for Patients: HairSlick.no  Fact Sheet for Healthcare Providers: quierodirigir.com  This test is not yet approved or cleared by the [place] FDA and  has been authorized for detection and/or diagnosis of SARS-CoV-2 by FDA under an Emergency Use Authorization (EUA). This EUA will remain  in effect (meaning this test can be used) for the duration of the COVID-19 declaration under Se ction 564(b)(1) of the Act, 21 U.S.C. section 360bbb-3(b)(1), unless the authorization is terminated or revoked sooner.  Performed at [facility], [address] [place], [place] [address]       Radiology Studies: DG Cervical Spine 2 or 3 views  Result Date: 05/05/2021 CLINICAL DATA:  Neck pain on the right side. EXAM: CERVICAL SPINE - 2-3 VIEW COMPARISON:  None. FINDINGS: There is no evidence of cervical spine fracture or prevertebral soft tissue swelling. 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 There are moderate to severe degenerative changes from C3-4 to C6-7. There is an anterior wedge  deformity of the C4 vertebral body with less than 25% height loss which appears chronic. 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 No acute osseous injury. Electronically Signed   By: Romona Curls M.D.   On: 05/05/2021 16:19   MR ANGIO HEAD WO CONTRAST  Result Date: 05/05/2021 CLINICAL DATA:  Neuro deficit, acute, stroke suspected. Slurred speech. EXAM: MRI HEAD WITHOUT CONTRAST MRA HEAD WITHOUT CONTRAST MRA OF THE NECK WITHOUT AND WITH CONTRAST TECHNIQUE: Multiplanar, multi-echo pulse sequences of the brain and surrounding structures were acquired without intravenous contrast. Angiographic images of the Circle of Willis were acquired using MRA technique without intravenous contrast. Angiographic images of the neck were acquired using MRA technique without and with intravenous contrast. Carotid stenosis measurements (when applicable) are obtained utilizing NASCET criteria, using the distal internal carotid diameter as the denominator. CONTRAST:  7.88mL GADAVIST GADOBUTROL 1 MMOL/ML IV SOLN COMPARISON:  Head CT 05/05/2020 FINDINGS: MR HEAD FINDINGS Brain: There is a small acute infarct involving the right insula. Patchy T2 hyperintensities in the cerebral white matter and pons are nonspecific but compatible with moderate chronic small vessel ischemic disease. Mild cerebral atrophy is within normal limits for age. No intracranial hemorrhage, mass, midline shift, or extra-axial fluid collection is identified. Vascular: Major intracranial vascular flow voids are preserved. Skull and upper cervical spine: Unremarkable bone marrow signal. Sinuses/Orbits: Unremarkable orbits. Moderate mucosal thickening in the left maxillary sinus. Small bilateral mastoid effusions. Other: None. MRA HEAD FINDINGS Anterior circulation: The internal carotid arteries are widely patent from skull base to carotid termini. The ACAs and MCAs are patent without evidence of a proximal branch occlusion or  significant proximal stenosis. No aneurysm is identified. Posterior circulation: The included intracranial portions of the vertebral arteries are patent to the basilar with the left being strongly dominant. The basilar artery is widely patent. There are moderate-sized posterior communicating arteries bilaterally. Both PCAs are patent without evidence of a significant proximal stenosis. No aneurysm is identified. Anatomic variants: Duplicated middle cerebral arteries bilaterally. Hypoplastic right vertebral artery. MRA NECK FINDINGS The study is mildly motion degraded. Aortic arch: Normal variant 4 vessel aortic arch with the left vertebral artery arising from the arch. Widely patent  arch vessel origins. Right carotid system: Patent without evidence of a significant stenosis or dissection. Left carotid system: Patent without evidence of a significant stenosis or dissection. Vertebral arteries: The vertebral arteries are patent with antegrade flow bilaterally and with the left being dominant. Motion artifact limits assessment of the vertebral artery origins, however there is no evidence of a significant stenosis or dissection on either side more distally. IMPRESSION: 1. 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 2. Moderate chronic small vessel ischemic disease. 3. Negative head MRA. 4. Negative neck MRA within limitations of mild motion artifact. Electronically Signed   By: Sebastian Ache M.D.   On: 05/05/2021 15:11   MR ANGIO NECK W WO CONTRAST  Result Date: 05/05/2021 CLINICAL DATA:  Neuro deficit, acute, stroke suspected. Slurred speech. EXAM: MRI HEAD WITHOUT CONTRAST MRA HEAD WITHOUT CONTRAST MRA OF THE NECK WITHOUT AND WITH CONTRAST TECHNIQUE: Multiplanar, multi-echo pulse sequences of the brain and surrounding structures were acquired without intravenous contrast. Angiographic images of the Circle of Willis were acquired using MRA technique without intravenous contrast. Angiographic images of the neck  were acquired using MRA technique without and with intravenous contrast. Carotid stenosis measurements (when applicable) are obtained utilizing NASCET criteria, using the distal internal carotid diameter as the denominator. CONTRAST:  7.47mL GADAVIST GADOBUTROL 1 MMOL/ML IV SOLN COMPARISON:  Head CT 05/05/2020 FINDINGS: MR HEAD FINDINGS Brain: There is a small acute infarct involving the right insula. Patchy T2 hyperintensities in the cerebral white matter and pons are nonspecific but compatible with moderate chronic small vessel ischemic disease. Mild cerebral atrophy is within normal limits for age. No intracranial hemorrhage, mass, midline shift, or extra-axial fluid collection is identified. Vascular: Major intracranial vascular flow voids are preserved. Skull and upper cervical spine: Unremarkable bone marrow signal. Sinuses/Orbits: Unremarkable orbits. Moderate mucosal thickening in the left maxillary sinus. Small bilateral mastoid effusions. Other: None. MRA HEAD FINDINGS Anterior circulation: The internal carotid arteries are widely patent from skull base to carotid termini. The ACAs and MCAs are patent without evidence of a proximal branch occlusion or significant proximal stenosis. No aneurysm is identified. Posterior circulation: The included intracranial portions of the vertebral arteries are patent to the basilar with the left being strongly dominant. The basilar artery is widely patent. There are moderate-sized posterior communicating arteries bilaterally. Both PCAs are patent without evidence of a significant proximal stenosis. No aneurysm is identified. Anatomic variants: Duplicated middle cerebral arteries bilaterally. Hypoplastic right vertebral artery. MRA NECK FINDINGS The study is mildly motion degraded. Aortic arch: Normal variant 4 vessel aortic arch with the left vertebral artery arising from the arch. Widely patent arch vessel origins. Right carotid system: Patent without evidence of a  significant stenosis or dissection. Left carotid system: Patent without evidence of a significant stenosis or dissection. Vertebral arteries: The vertebral arteries are patent with antegrade flow bilaterally and with the left being dominant. Motion artifact limits assessment of the vertebral artery origins, however there is no evidence of a significant stenosis or dissection on either side more distally. IMPRESSION: 1. 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 2. Moderate chronic small vessel ischemic disease. 3. Negative head MRA. 4. Negative neck MRA within limitations of mild motion artifact. Electronically Signed   By: Sebastian Ache M.D.   On: 05/05/2021 15:11   MR BRAIN WO CONTRAST  Result Date: 05/05/2021 CLINICAL DATA:  Neuro deficit, acute, stroke suspected. Slurred speech. EXAM: MRI HEAD WITHOUT CONTRAST MRA HEAD WITHOUT CONTRAST MRA OF THE NECK WITHOUT AND WITH  CONTRAST TECHNIQUE: Multiplanar, multi-echo pulse sequences of the brain and surrounding structures were acquired without intravenous contrast. Angiographic images of the Circle of Willis were acquired using MRA technique without intravenous contrast. Angiographic images of the neck were acquired using MRA technique without and with intravenous contrast. Carotid stenosis measurements (when applicable) are obtained utilizing NASCET criteria, using the distal internal carotid diameter as the denominator. CONTRAST:  7.43mL GADAVIST GADOBUTROL 1 MMOL/ML IV SOLN COMPARISON:  Head CT 05/05/2020 FINDINGS: MR HEAD FINDINGS Brain: There is a small acute infarct involving the right insula. Patchy T2 hyperintensities in the cerebral white matter and pons are nonspecific but compatible with moderate chronic small vessel ischemic disease. Mild cerebral atrophy is within normal limits for age. No intracranial hemorrhage, mass, midline shift, or extra-axial fluid collection is identified. Vascular: Major intracranial vascular flow voids are preserved.  Skull and upper cervical spine: Unremarkable bone marrow signal. Sinuses/Orbits: Unremarkable orbits. Moderate mucosal thickening in the left maxillary sinus. Small bilateral mastoid effusions. Other: None. MRA HEAD FINDINGS Anterior circulation: The internal carotid arteries are widely patent from skull base to carotid termini. The ACAs and MCAs are patent without evidence of a proximal branch occlusion or significant proximal stenosis. No aneurysm is identified. Posterior circulation: The included intracranial portions of the vertebral arteries are patent to the basilar with the left being strongly dominant. The basilar artery is widely patent. There are moderate-sized posterior communicating arteries bilaterally. Both PCAs are patent without evidence of a significant proximal stenosis. No aneurysm is identified. Anatomic variants: Duplicated middle cerebral arteries bilaterally. Hypoplastic right vertebral artery. MRA NECK FINDINGS The study is mildly motion degraded. Aortic arch: Normal variant 4 vessel aortic arch with the left vertebral artery arising from the arch. Widely patent arch vessel origins. Right carotid system: Patent without evidence of a significant stenosis or dissection. Left carotid system: Patent without evidence of a significant stenosis or dissection. Vertebral arteries: The vertebral arteries are patent with antegrade flow bilaterally and with the left being dominant. Motion artifact limits assessment of the vertebral artery origins, however there is no evidence of a significant stenosis or dissection on either side more distally. IMPRESSION: 1. 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 2. Moderate chronic small vessel ischemic disease. 3. Negative head MRA. 4. Negative neck MRA within limitations of mild motion artifact. Electronically Signed   By: Sebastian Ache M.D.   On: 05/05/2021 15:11   CT HEAD CODE STROKE WO CONTRAST  Result Date: 05/05/2021 CLINICAL DATA:  Code stroke.  Neuro deficit, acute, stroke suspected. Left facial droop and slurred speech. EXAM: CT HEAD WITHOUT CONTRAST TECHNIQUE: Contiguous axial images were obtained from the base of the skull through the vertex without intravenous contrast. COMPARISON:  None. FINDINGS: Brain: There is no evidence of an acute infarct, intracranial hemorrhage, mass, midline shift, or extra-axial fluid collection. Hypodensities in the cerebral white matter bilaterally are nonspecific but compatible with mild chronic small vessel ischemic disease. Mild cerebral atrophy is within normal limits for age. Vascular: Calcified atherosclerosis at the skull base. No hyperdense vessel. Skull: No fracture or suspicious osseous lesion. Sinuses/Orbits: Moderate mucosal thickening in the left maxillary sinus. Clear mastoid air cells. Unremarkable orbits. Other: None. ASPECTS [facility] Stroke Program Early CT Score) - Ganglionic level infarction (caudate, lentiform nuclei, internal capsule, insula, M1-M3 cortex): 7 - Supraganglionic infarction (M4-M6 cortex): 3 Total score (0-10 with 10 being normal): 10 IMPRESSION: 1. No evidence of acute intracranial abnormality. 2. ASPECTS is 10. 3. Mild chronic small vessel ischemic disease.  These results were communicated to Dr. Stack at 12:27 pm on 05/05/2021 by text page via the [facility] messaging system. Electronically Signed   By: Sebastian AcheAllen  Grady M.D.   On: 05/05/2021 12:27      Scheduled Meds:  aspirin EC  81 mg Oral Daily   atorvastatin  80 mg Oral Daily   clopidogrel  75 mg Oral Daily   clotrimazole   Topical BID   enoxaparin (LOVENOX) injection  40 mg Subcutaneous Q24H   flecainide  50 mg Oral BID   fluticasone furoate-vilanterol  1 puff Inhalation Daily   And   umeclidinium bromide  1 puff Inhalation Daily   leflunomide  20 mg Oral Daily   lidocaine  1 patch Transdermal Q24H   metoprolol succinate  25 mg Oral Daily   nystatin   Topical BID   pantoprazole  40 mg Oral Daily   predniSONE  5 mg Oral Q  breakfast   Continuous Infusions:  diltiazem (CARDIZEM) infusion 5 mg/hr (05/06/21 1133)     LOS: 0 days    Total critical care time: 35 minutes. Time 9-9:20am, 10:20-10:35am Critical care time was exclusive of separately billable procedures and treating other patients. Critical care was necessary to treat or prevent imminent or life-threatening deterioration. Critical care was time spent personally by me on the following activities: development of treatment plan with patient and/or surrogate as well as nursing, discussions with consultants, evaluation of patient's response to treatment, examination of patient, obtaining history from patient or surrogate, ordering and performing treatments and interventions, ordering and review of laboratory studies, ordering and review of radiographic studies, pulse oximetry and re-evaluation of patient's condition.   Noralee StainJennifer Rodriques Badie, DO [facility] 05/06/2021, 11:35 AM   Available via Epic secure chat 7am-7pm After these hours, please refer to coverage provider listed on [email or web address]

## 2021-05-07 DIAGNOSIS — E785 Hyperlipidemia, unspecified: Secondary | ICD-10-CM | POA: Diagnosis not present

## 2021-05-07 DIAGNOSIS — I639 Cerebral infarction, unspecified: Secondary | ICD-10-CM | POA: Diagnosis not present

## 2021-05-07 DIAGNOSIS — I471 Supraventricular tachycardia: Secondary | ICD-10-CM

## 2021-05-07 LAB — BASIC METABOLIC PANEL
Anion gap: 10 (ref 5–15)
BUN: 7 mg/dL — ABNORMAL LOW (ref 8–23)
CO2: 25 mmol/L (ref 22–32)
Calcium: 9.4 mg/dL (ref 8.9–10.3)
Chloride: 100 mmol/L (ref 98–111)
Creatinine, Ser: 0.83 mg/dL (ref 0.44–1.00)
GFR, Estimated: >60 mL/min (ref >60)
Glucose, Bld: 103 mg/dL — ABNORMAL HIGH (ref 70–99)
Potassium: 3.9 mmol/L (ref 3.5–5.1)
Sodium: 135 mmol/L (ref 135–145)

## 2021-05-07 LAB — TSH: TSH: 1.007 u[IU]/mL (ref 0.350–4.500)

## 2021-05-07 LAB — POTASSIUM: Potassium: 4.2 mmol/L (ref 3.5–5.1)

## 2021-05-07 LAB — MAGNESIUM: Magnesium: 1.8 mg/dL (ref 1.7–2.4)

## 2021-05-07 LAB — HEMOGLOBIN A1C
Hgb A1c MFr Bld: 5.4 % (ref 4.8–5.6)
Mean Plasma Glucose: 108 mg/dL

## 2021-05-07 MED ORDER — METOPROLOL SUCCINATE ER 25 MG PO TB24
25.0000 mg | ORAL_TABLET | Freq: Once | ORAL | Status: AC
  Administered 2021-05-07: 25 mg via ORAL
  Filled 2021-05-07: qty 1

## 2021-05-07 MED ORDER — DILTIAZEM HCL ER COATED BEADS 120 MG PO CP24
120.0000 mg | ORAL_CAPSULE | Freq: Two times a day (BID) | ORAL | Status: DC
  Administered 2021-05-07 – 2021-05-09 (×5): 120 mg via ORAL
  Filled 2021-05-07 (×5): qty 1

## 2021-05-07 MED ORDER — DIGOXIN 0.25 MG/ML IJ SOLN
0.2500 mg | Freq: Every day | INTRAMUSCULAR | Status: DC
  Filled 2021-05-07: qty 1

## 2021-05-07 MED ORDER — METOPROLOL TARTRATE 5 MG/5ML IV SOLN
5.0000 mg | Freq: Four times a day (QID) | INTRAVENOUS | Status: DC | PRN
  Administered 2021-05-07 (×2): 5 mg via INTRAVENOUS
  Filled 2021-05-07 (×4): qty 5

## 2021-05-07 NOTE — Progress Notes (Signed)
STROKE TEAM PROGRESS NOTE   INTERVAL HISTORY No family at the bedside. Pt sitting in chair, still has episodes of SVT this am, HR up to 190s. Self resolved now, still on cardizem drip, and po flecainide and metoprolol. Cardiology on board.   OBJECTIVE Vitals:   05/07/21 0802 05/07/21 0806 05/07/21 0902 05/07/21 1121  BP: 113/60     Pulse: 89 90    Resp: 13 19    Temp:    98.5 F (36.9 C)  TempSrc:      SpO2: 96% 94% 97%   Weight:      Height:        CBC:  Recent Labs  Lab 05/05/21 1211 05/05/21 1219  WBC 9.9  --   NEUTROABS 8.1*  --   HGB 12.6 13.3  HCT 38.0 39.0  MCV 108.9*  --   PLT 293  --     Basic Metabolic Panel:  Recent Labs  Lab 05/05/21 1211 05/05/21 1219 05/07/21 1233  NA 135 135 135  K 3.6 3.9 3.9  CL 98 98 100  CO2 26  --  25  GLUCOSE 172* 168* 103*  BUN 10 11 7*  CREATININE 0.90 0.70 0.83  CALCIUM 9.7  --  9.4    Lipid Panel: No results found for: CHOL, TRIG, HDL, CHOLHDL, VLDL, LDLCALC HgbA1c:  Lab Results  Component Value Date   HGBA1C 5.4 05/05/2021   Urine Drug Screen:     Component Value Date/Time   LABOPIA NONE DETECTED 05/06/2021 0851   COCAINSCRNUR NONE DETECTED 05/06/2021 0851   LABBENZ NONE DETECTED 05/06/2021 0851   AMPHETMU NONE DETECTED 05/06/2021 0851   THCU NONE DETECTED 05/06/2021 0851   LABBARB NONE DETECTED 05/06/2021 0851    Alcohol Level No results found for: [facility]  IMAGING  DG Cervical Spine 2 or 3 views  Result Date: 05/05/2021 CLINICAL DATA:  Neck pain on the right side. EXAM: CERVICAL SPINE - 2-3 VIEW COMPARISON:  None. FINDINGS: There is no evidence of cervical spine fracture or prevertebral soft tissue swelling. 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 There are moderate to severe degenerative changes from C3-4 to C6-7. There is an anterior wedge deformity of the C4 vertebral body with less than 25% height loss which appears chronic. IMPRESSION: Moderate to severe degenerative changes in the mid cervical  spine. No acute osseous injury. Electronically Signed   By: Romona Curlsyler  Litton M.D.   On: 05/05/2021 16:19   MR ANGIO HEAD WO CONTRAST  Result Date: 05/05/2021 CLINICAL DATA:  Neuro deficit, acute, stroke suspected. Slurred speech. EXAM: MRI HEAD WITHOUT CONTRAST MRA HEAD WITHOUT CONTRAST MRA OF THE NECK WITHOUT AND WITH CONTRAST TECHNIQUE: Multiplanar, multi-echo pulse sequences of the brain and surrounding structures were acquired without intravenous contrast. Angiographic images of the Circle of Willis were acquired using MRA technique without intravenous contrast. Angiographic images of the neck were acquired using MRA technique without and with intravenous contrast. Carotid stenosis measurements (when applicable) are obtained utilizing NASCET criteria, using the distal internal carotid diameter as the denominator. CONTRAST:  7.241mL GADAVIST GADOBUTROL 1 MMOL/ML IV SOLN COMPARISON:  Head CT 05/05/2020 FINDINGS: MR HEAD FINDINGS Brain: There is a small acute infarct involving the right insula. Patchy T2 hyperintensities in the cerebral white matter and pons are nonspecific but compatible with moderate chronic small vessel ischemic disease. Mild cerebral atrophy is within normal limits for age. No intracranial hemorrhage, mass, midline shift, or extra-axial fluid collection is identified. Vascular: Major intracranial vascular flow voids  are preserved. Skull and upper cervical spine: Unremarkable bone marrow signal. Sinuses/Orbits: Unremarkable orbits. Moderate mucosal thickening in the left maxillary sinus. Small bilateral mastoid effusions. Other: None. MRA HEAD FINDINGS Anterior circulation: The internal carotid arteries are widely patent from skull base to carotid termini. The ACAs and MCAs are patent without evidence of a proximal branch occlusion or significant proximal stenosis. No aneurysm is identified. Posterior circulation: The included intracranial portions of the vertebral arteries are patent to the  basilar with the left being strongly dominant. The basilar artery is widely patent. There are moderate-sized posterior communicating arteries bilaterally. Both PCAs are patent without evidence of a significant proximal stenosis. No aneurysm is identified. Anatomic variants: Duplicated middle cerebral arteries bilaterally. Hypoplastic right vertebral artery. MRA NECK FINDINGS The study is mildly motion degraded. Aortic arch: Normal variant 4 vessel aortic arch with the left vertebral artery arising from the arch. Widely patent arch vessel origins. Right carotid system: Patent without evidence of a significant stenosis or dissection. Left carotid system: Patent without evidence of a significant stenosis or dissection. Vertebral arteries: The vertebral arteries are patent with antegrade flow bilaterally and with the left being dominant. Motion artifact limits assessment of the vertebral artery origins, however there is no evidence of a significant stenosis or dissection on either side more distally. IMPRESSION: 1. ectronically Signed   By: Sebastian Ache M.D.   On: 05/05/2021 15:11   MR ANGIO NECK W WO CONTRAST  Result Date: 05/05/2021 CLINICAL DATA:  Neuro deficit, acute, stroke suspected. Slurred speech. EXAM: MRI HEAD WITHOUT CONTRAST MRA HEAD WITHOUT CONTRAST MRA OF THE NECK WITHOUT AND WITH CONTRAST TECHNIQUE: Multiplanar, multi-echo pulse sequences of the brain and surrounding structures were acquired without intravenous contrast. Angiographic images of the Circle of Willis were acquired using MRA technique without intravenous contrast. Angiographic images of the neck were acquired using MRA technique without and with intravenous contrast. Carotid stenosis measurements (when applicable) are obtained utilizing NASCET criteria, using the distal internal carotid diameter as the denominator. CONTRAST:  7.52mL GADAVIST GADOBUTROL 1 MMOL/ML IV SOLN COMPARISON:  Head CT 05/05/2020 FINDINGS: MR HEAD FINDINGS Brain:  There is a small acute infarct involving the right insula. Patchy T2 hyperintensities in the cerebral white matter and pons are nonspecific but compatible with moderate chronic small vessel ischemic disease. Mild cerebral atrophy is within normal limits for age. No intracranial hemorrhage, mass, midline shift, or extra-axial fluid collection is identified. Vascular: Major intracranial vascular flow voids are preserved. Skull and upper cervical spine: Unremarkable bone marrow signal. Sinuses/Orbits: Unremarkable orbits. Moderate mucosal thickening in the left maxillary sinus. Small bilateral mastoid effusions. Other: None. MRA HEAD FINDINGS Anterior circulation: The internal carotid arteries are widely patent from skull base to carotid termini. The ACAs and MCAs are patent without evidence of a proximal branch occlusion or significant proximal stenosis. No aneurysm is identified. Posterior circulation: The included intracranial portions of the vertebral arteries are patent to the basilar with the left being strongly dominant. The basilar artery is widely patent. There are moderate-sized posterior communicating arteries bilaterally. Both PCAs are patent without evidence of a significant proximal stenosis. No aneurysm is identified. Anatomic variants: Duplicated middle cerebral arteries bilaterally. Hypoplastic right vertebral artery. MRA NECK FINDINGS The study is mildly motion degraded. Aortic arch: Normal variant 4 vessel aortic arch with the left vertebral artery arising from the arch. Widely patent arch vessel origins. Right carotid system: Patent without evidence of a significant stenosis or dissection. Left carotid system: Patent without evidence of  a significant stenosis or dissection. Vertebral arteries: The vertebral arteries are patent with antegrade flow bilaterally and with the left being dominant. Motion artifact limits assessment of the vertebral artery origins, however there is no evidence of a  significant stenosis or dissection on either side more distally. IMPRESSION: 1. 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 2. Moderate chronic small vessel ischemic disease. 3. Negative head MRA. 4. Negative neck MRA within limitations of mild motion artifact. Electronically Signed   By: Sebastian Ache M.D.   On: 05/05/2021 15:11   MR BRAIN WO CONTRAST  Result Date: 05/05/2021 CLINICAL DATA:  Neuro deficit, acute, stroke suspected. Slurred speech. EXAM: MRI HEAD WITHOUT CONTRAST MRA HEAD WITHOUT CONTRAST MRA OF THE NECK WITHOUT AND WITH CONTRAST TECHNIQUE: Multiplanar, multi-echo pulse sequences of the brain and surrounding structures were acquired without intravenous contrast. Angiographic images of the Circle of Willis were acquired using MRA technique without intravenous contrast. Angiographic images of the neck were acquired using MRA technique without and with intravenous contrast. Carotid stenosis measurements (when applicable) are obtained utilizing NASCET criteria, using the distal internal carotid diameter as the denominator. CONTRAST:  7.62mL GADAVIST GADOBUTROL 1 MMOL/ML IV SOLN COMPARISON:  Head CT 05/05/2020 FINDINGS: MR HEAD FINDINGS Brain: There is a small acute infarct involving the right insula. Patchy T2 hyperintensities in the cerebral white matter and pons are nonspecific but compatible with moderate chronic small vessel ischemic disease. Mild cerebral atrophy is within normal limits for age. No intracranial hemorrhage, mass, midline shift, or extra-axial fluid collection is identified. Vascular: Major intracranial vascular flow voids are preserved. Skull and upper cervical spine: Unremarkable bone marrow signal. Sinuses/Orbits: Unremarkable orbits. Moderate mucosal thickening in the left maxillary sinus. Small bilateral mastoid effusions. Other: None. MRA HEAD FINDINGS Anterior circulation: The internal carotid arteries are widely patent from skull base to carotid termini. The ACAs and  MCAs are patent without evidence of a proximal branch occlusion or significant proximal stenosis. No aneurysm is identified. Posterior circulation: The included intracranial portions of the vertebral arteries are patent to the basilar with the left being strongly dominant. The basilar artery is widely patent. There are moderate-sized posterior communicating arteries bilaterally. Both PCAs are patent without evidence of a significant proximal stenosis. No aneurysm is identified. Anatomic variants: Duplicated middle cerebral arteries bilaterally. Hypoplastic right vertebral artery. MRA NECK FINDINGS The study is mildly motion degraded. Aortic arch: Normal variant 4 vessel aortic arch with the left vertebral artery arising from the arch. Widely patent arch vessel origins. Right carotid system: Patent without evidence of a significant stenosis or dissection. Left carotid system: Patent without evidence of a significant stenosis or dissection. Vertebral arteries: The vertebral arteries are patent with antegrade flow bilaterally and with the left being dominant. Motion artifact limits assessment of the vertebral artery origins, however there is no evidence of a significant stenosis or dissection on either side more distally. IMPRESSION: 1. 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 2. Moderate chronic small vessel ischemic disease. 3. Negative head MRA. 4. Negative neck MRA within limitations of mild motion artifact. Electronically Signed   By: Sebastian Ache M.D.   On: 05/05/2021 15:11   CT HEAD CODE STROKE WO CONTRAST  Result Date: 05/05/2021 CLINICAL DATA:  Code stroke. Neuro deficit, acute, stroke suspected. Left facial droop and slurred speech. EXAM: CT HEAD WITHOUT CONTRAST TECHNIQUE: Contiguous axial images were obtained from the base of the skull through the vertex without intravenous contrast. COMPARISON:  None. FINDINGS: Brain: There is no evidence  of an acute infarct, intracranial hemorrhage, mass,  midline shift, or extra-axial fluid collection. Hypodensities in the cerebral white matter bilaterally are nonspecific but compatible with mild chronic small vessel ischemic disease. Mild cerebral atrophy is within normal limits for age. Vascular: Calcified atherosclerosis at the skull base. No hyperdense vessel. Skull: No fracture or suspicious osseous lesion. Sinuses/Orbits: Moderate mucosal thickening in the left maxillary sinus. Clear mastoid air cells. Unremarkable orbits. Other: None. ASPECTS [facility] Stroke Program Early CT Score) - Ganglionic level infarction (caudate, lentiform nuclei, internal capsule, insula, M1-M3 cortex): 7 - Supraganglionic infarction (M4-M6 cortex): 3 Total score (0-10 with 10 being normal): 10 IMPRESSION: 1. No evidence of acute intracranial abnormality. 2. ASPECTS is 10. 3. Mild chronic small vessel ischemic disease. These results were communicated to Dr. Selina Cooley at 12:27 pm on 05/05/2021 by text page via the [facility] messaging system. Electronically Signed   By: Sebastian Ache M.D.   On: 05/05/2021 12:27    ECG - SR rate 99 BPM. (See cardiology reading for complete details)   PHYSICAL EXAM  Temp:  [97.5 F (36.4 C)-98.5 F (36.9 C)] 98.5 F (36.9 C) (07/25 1121) Pulse Rate:  [71-190] 90 (07/25 0806) Resp:  [13-30] 19 (07/25 0806) BP: (97-136)/(52-93) 113/60 (07/25 0802) SpO2:  [94 %-100 %] 97 % (07/25 0902)  General - Well nourished, well developed, in no apparent distress.  Ophthalmologic - fundi not visualized due to noncooperation.  Cardiovascular - Regular rhythm and rate.  Mental Status -  Level of arousal and orientation to time, place, and person were intact. Language including expression, naming, repetition, comprehension was assessed and found intact.  Cranial Nerves II - XII - II - Visual field intact OU. III, IV, VI - Extraocular movements intact. V - Facial sensation intact bilaterally. VII - Facial movement intact bilaterally. VIII - Hearing &  vestibular intact bilaterally. X - Palate elevates symmetrically. XI - Chin turning & shoulder shrug intact bilaterally. XII - Tongue protrusion intact.  Motor Strength - The patient's strength was normal in all extremities and pronator drift was absent.  Bulk was normal and fasciculations were absent.   Motor Tone - Muscle tone was assessed at the neck and appendages and was normal.  Reflexes - The patient's reflexes were symmetrical in all extremities and she had no pathological reflexes.  Sensory - Light touch, temperature/pinprick were assessed and were symmetrical.    Coordination - The patient had normal movements in the hands with no ataxia or dysmetria.  Tremor was absent.  Gait and Station - deferred.    ASSESSMENT/PLAN Ms. Sandra Nielsen is a 70 y.o. female with history of SVT on flecainide, breast cancer status post right mastectomy with no recurrence, and history of chronic bronchitis who presented with acute onset left-sided weakness, dysarthria, and drooling  She did not receive IV t-PA due to late presentation (>4.5 hours from time of onset).   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 fib as a cause of current stroke CT Head - No evidence of acute intracranial abnormality. ASPECTS is 10.  MRI head - Small acute infarct involving the right insula. Moderate chronic small vessel ischemic disease.  MRA head and Neck - Negative head and neck MRA. 2D Echo EF 60 to 65% LE venous doppler no DVT Recommend loop recorder placement.  Patient prefer her cardiologist Dr. Sampson Goon at [facility], but will discuss with EP. LDL - 57.3 HgbA1c 5.4  UDS neg VTE prophylaxis - Lovenox No antithrombotic prior to admission, now on aspirin 81 mg daily and clopidogrel 75 mg daily DAPT for 3 weeks and then aspirin alone. Ongoing aggressive stroke risk factor management Therapy  recommendations:  pending Disposition:  Pending  SVT Follow-up with Dr. Sampson Goon at [facility] On home meds of Cardizem, metoprolol and flecainide Still has intermittent episodes of SVT On IV Cardizem On home flecainide and metoprolol Cardiology on board Recommend loop recorder placement.  Patient prefer her cardiologist Dr. Sampson Goon at [facility], but will discuss with our EP.  Hypertension Home BP meds: Cardizem, Toprol  Current BP meds: Cardizem, metoprolol Mildly low at times Long-term BP goal normotensive  Hyperlipidemia Home Lipid lowering medication: Lipitor 20 mg QD LDL 57.3, goal < 70 Lipitor 20 mg daily, no need high intensity due to LDL at goal Continue statin at discharge  Tobacco abuse Current smoker Smoking cessation counseling provided Pt is willing to quit        Other Stroke Risk Factors Advanced age        Other Active Problems, Findings, Recommendations and/or Plan Code status - Full Code RA - on Arava and prednisone COPD   Hospital day # 1  Marvel Plan, MD PhD Stroke Neurology 05/07/2021 2:29 PM   To contact Stroke Continuity provider, please refer to [email or web address]. After hours, contact General Neurology

## 2021-05-07 NOTE — Progress Notes (Signed)
MD at bedside, HR in the 200's no SOB or any other symptoms. Pt states she can just feel her heart beating fast. MD will consult cardiology. Pt walking to the BR and HR is 196.

## 2021-05-07 NOTE — Progress Notes (Signed)
PROGRESS NOTE    Sandra Nielsen  ZOX:[id number] DOB: 06-Apr-1951 DOA: 05/05/2021 PCP: Shellia Cleverly, PA   Brief Narrative:   Sandra Nielsen is a 70 y.o. female with medical history significant of SVT on flecainide, breast cancer s/p right mastectomy, chronic bronchitis, RA, HLD and tobacco abuse who presented to ER with slurred speech and drooling that started around 9AM this morning.  Work-up revealed small acute infarct in the right insula.  Neurology was consulted.  -She is noted to have recurrent SVT yesterday for which Cardizem drip was initiated.  Heart rates continue to elevate this morning and Cardizem drip had to be titrated up.  Due to ongoing poor control, cardiology evaluation was requested.  She was about to receive adenosine at which point she converted to sinus rhythm spontaneously.  Plan is to currently continue on drip with further cardiology recommendations pending.   Assessment & Plan:   Principal Problem:   Acute stroke due to ischemia [facility]) Active Problems:   Chronic bronchitis (HCC)   Closed fracture of single pubic ramus of pelvis, initial encounter (HCC)   Dyslipidemia   Frequent PVCs   Rheumatoid arthritis with positive rheumatoid factor (HCC)   Tobacco abuse   Neck pain on right side   Intertriginous candidiasis   CVA (cerebral vascular accident) (HCC)   Acute stroke -MRI brain revealed small acute infarct right insula, negative head and neck MRA -Echo performed 7/24 with LVEF 60-65% and grade 1 diastolic dysfunction -Lower extremity DVT negative on preliminary evaluation -Stroke team following -Continue aspirin, Plavix, Lipitor -PT OT evaluation pending -Currently on heart healthy diet -Implantable loop recorder will likely be placed by her cardiologist Dr. Sampson Goon  SVT-recurrent -Has history of SVT, is on flecainide, Toprol, Cardizem at home -Continue flecainide, Toprol, and home Cardizem -Started on Cardizem drip due to missing oral doses after  admission and going into SVT with rate in the 200s -IV metoprolol ordered as needed for rate control -Appreciate cardiology evaluation and recommendations, may require ablation if heart rates do not become better controlled on medications -Plan to check TSH   Rheumatoid arthritis -Continue Arava, prednisone   GERD -Continue PPI     DVT prophylaxis: Lovenox Code Status: Full Family Communication: Discussed with daughter Sandra Nielsen on phone 7/25 Disposition Plan:  Status is: Inpatient  Remains inpatient appropriate because:Ongoing diagnostic testing needed not appropriate for outpatient work up, IV treatments appropriate due to intensity of illness or inability to take PO, and Inpatient level of care appropriate due to severity of illness  Dispo: The patient is from: Home              Anticipated d/c is to: Home              Patient currently is not medically stable to d/c.   Difficult to place patient No   Consultants:  Neurology Cardiology  Procedures:  See below  Antimicrobials:  None   Subjective: Patient seen and evaluated today with elevated heart rates noted this morning.  No symptomatology noted aside from mild palpitations.  She denies any chest pain or shortness of breath  Objective: Vitals:   05/07/21 0802 05/07/21 0806 05/07/21 0902 05/07/21 1121  BP: 113/60     Pulse: 89 90    Resp: 13 19    Temp:    98.5 F (36.9 C)  TempSrc:      SpO2: 96% 94% 97%   Weight:      Height:  No intake or output data in the 24 hours ending 05/07/21 1312 Filed Weights   05/05/21 1235  Weight: 71.6 kg    Examination:  General exam: Appears calm and comfortable  Respiratory system: Clear to auscultation. Respiratory effort normal. Cardiovascular system: S1 & S2 heard, RRR.  Tachycardic. Gastrointestinal system: Abdomen is soft Central nervous system: Alert and awake Extremities: No edema Skin: No significant lesions noted Psychiatry: Flat affect.    Data  Reviewed: I have personally reviewed following labs and imaging studies  CBC: Recent Labs  Lab 05/05/21 1211 05/05/21 1219  WBC 9.9  --   NEUTROABS 8.1*  --   HGB 12.6 13.3  HCT 38.0 39.0  MCV 108.9*  --   PLT 293  --    Basic Metabolic Panel: Recent Labs  Lab 05/05/21 1211 05/05/21 1219  NA 135 135  K 3.6 3.9  CL 98 98  CO2 26  --   GLUCOSE 172* 168*  BUN 10 11  CREATININE 0.90 0.70  CALCIUM 9.7  --    GFR: Estimated Creatinine Clearance: 69.4 mL/min (by C-G formula based on SCr of 0.7 mg/dL). Liver Function Tests: Recent Labs  Lab 05/05/21 1211  AST 23  ALT 19  ALKPHOS 132*  BILITOT 0.7  PROT 6.5  ALBUMIN 3.3*   No results for input(s): LIPASE, AMYLASE in the last 168 hours. No results for input(s): AMMONIA in the last 168 hours. Coagulation Profile: Recent Labs  Lab 05/05/21 1211  INR 1.0   Cardiac Enzymes: No results for input(s): CKTOTAL, CKMB, CKMBINDEX, TROPONINI in the last 168 hours. BNP (last 3 results) No results for input(s): PROBNP in the last 8760 hours. HbA1C: Recent Labs    05/05/21 1736  HGBA1C 5.4   CBG: Recent Labs  Lab 05/05/21 1209  GLUCAP 205*   Lipid Profile: Recent Labs    05/05/21 1736  LDLDIRECT 57.3   Thyroid Function Tests: No results for input(s): TSH, T4TOTAL, FREET4, T3FREE, THYROIDAB in the last 72 hours. Anemia Panel: No results for input(s): VITAMINB12, FOLATE, FERRITIN, TIBC, IRON, RETICCTPCT in the last 72 hours. Sepsis Labs: No results for input(s): PROCALCITON, LATICACIDVEN in the last 168 hours.  Recent Results (from the past 240 hour(s))  SARS CORONAVIRUS 2 (TAT 6-24 HRS) Nasopharyngeal Nasopharyngeal Swab     Status: None   Collection Time: 05/05/21  2:08 PM   Specimen: Nasopharyngeal Swab  Result Value Ref Range Status   SARS Coronavirus 2 NEGATIVE NEGATIVE Final    Comment: (NOTE) SARS-CoV-2 target nucleic acids are NOT DETECTED.  The SARS-CoV-2 RNA is generally detectable in upper and  lower respiratory specimens during the acute phase of infection. Negative results do not preclude SARS-CoV-2 infection, do not rule out co-infections with other pathogens, and should not be used as the sole basis for treatment or other patient management decisions. Negative results must be combined with clinical observations, patient history, and epidemiological information. The expected result is Negative.  Fact Sheet for Patients: HairSlick.no  Fact Sheet for Healthcare Providers: quierodirigir.com  This test is not yet approved or cleared by the [place] FDA and  has been authorized for detection and/or diagnosis of SARS-CoV-2 by FDA under an Emergency Use Authorization (EUA). This EUA will remain  in effect (meaning this test can be used) for the duration of the COVID-19 declaration under Se ction 564(b)(1) of the Act, 21 U.S.C. section 360bbb-3(b)(1), unless the authorization is terminated or revoked sooner.  Performed at [facility], [address] [place], [place] [address]          Radiology Studies: DG Cervical Spine 2 or 3 views  Result Date: 05/05/2021 CLINICAL DATA:  Neck pain on the right side. EXAM: CERVICAL SPINE - 2-3 VIEW COMPARISON:  None. FINDINGS: There is no evidence of cervical spine fracture or prevertebral soft tissue swelling. 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 There are moderate to severe degenerative changes from C3-4 to C6-7. There is an anterior wedge deformity of the C4 vertebral body with less than 25% height loss which appears chronic. 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 No acute osseous injury. Electronically Signed   By: Romona Curlsyler  Litton M.D.   On: 05/05/2021 16:19   MR ANGIO HEAD WO CONTRAST  Result Date: 05/05/2021 CLINICAL DATA:  Neuro deficit, acute, stroke suspected. Slurred speech. EXAM: MRI HEAD WITHOUT CONTRAST MRA HEAD  WITHOUT CONTRAST MRA OF THE NECK WITHOUT AND WITH CONTRAST TECHNIQUE: Multiplanar, multi-echo pulse sequences of the brain and surrounding structures were acquired without intravenous contrast. Angiographic images of the Circle of Willis were acquired using MRA technique without intravenous contrast. Angiographic images of the neck were acquired using MRA technique without and with intravenous contrast. Carotid stenosis measurements (when applicable) are obtained utilizing NASCET criteria, using the distal internal carotid diameter as the denominator. CONTRAST:  7.391mL GADAVIST GADOBUTROL 1 MMOL/ML IV SOLN COMPARISON:  Head CT 05/05/2020 FINDINGS: MR HEAD FINDINGS Brain: There is a small acute infarct involving the right insula. Patchy T2 hyperintensities in the cerebral white matter and pons are nonspecific but compatible with moderate chronic small vessel ischemic disease. Mild cerebral atrophy is within normal limits for age. No intracranial hemorrhage, mass, midline shift, or extra-axial fluid collection is identified. Vascular: Major intracranial vascular flow voids are preserved. Skull and upper cervical spine: Unremarkable bone marrow signal. Sinuses/Orbits: Unremarkable orbits. Moderate mucosal thickening in the left maxillary sinus. Small bilateral mastoid effusions. Other: None. MRA HEAD FINDINGS Anterior circulation: The internal carotid arteries are widely patent from skull base to carotid termini. The ACAs and MCAs are patent without evidence of a proximal branch occlusion or significant proximal stenosis. No aneurysm is identified. Posterior circulation: The included intracranial portions of the vertebral arteries are patent to the basilar with the left being strongly dominant. The basilar artery is widely patent. There are moderate-sized posterior communicating arteries bilaterally. Both PCAs are patent without evidence of a significant proximal stenosis. No aneurysm is identified. Anatomic variants:  Duplicated middle cerebral arteries bilaterally. Hypoplastic right vertebral artery. MRA NECK FINDINGS The study is mildly motion degraded. Aortic arch: Normal variant 4 vessel aortic arch with the left vertebral artery arising from the arch. Widely patent arch vessel origins. Right carotid system: Patent without evidence of a significant stenosis or dissection. Left carotid system: Patent without evidence of a significant stenosis or dissection. Vertebral arteries: The vertebral arteries are patent with antegrade flow bilaterally and with the left being dominant. Motion artifact limits assessment of the vertebral artery origins, however there is no evidence of a significant stenosis or dissection on either side more distally. IMPRESSION: 1. 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 2. Moderate chronic small vessel ischemic disease. 3. Negative head MRA. 4. Negative neck MRA within limitations of mild motion artifact. Electronically Signed   By: Sebastian AcheAllen  Grady M.D.   On: 05/05/2021 15:11   MR ANGIO NECK W WO CONTRAST  Result Date: 05/05/2021 CLINICAL DATA:  Neuro deficit, acute, stroke suspected. Slurred speech. EXAM: MRI  HEAD WITHOUT CONTRAST MRA HEAD WITHOUT CONTRAST MRA OF THE NECK WITHOUT AND WITH CONTRAST TECHNIQUE: Multiplanar, multi-echo pulse sequences of the brain and surrounding structures were acquired without intravenous contrast. Angiographic images of the Circle of Willis were acquired using MRA technique without intravenous contrast. Angiographic images of the neck were acquired using MRA technique without and with intravenous contrast. Carotid stenosis measurements (when applicable) are obtained utilizing NASCET criteria, using the distal internal carotid diameter as the denominator. CONTRAST:  7.61mL GADAVIST GADOBUTROL 1 MMOL/ML IV SOLN COMPARISON:  Head CT 05/05/2020 FINDINGS: MR HEAD FINDINGS Brain: There is a small acute infarct involving the right insula. Patchy T2 hyperintensities in  the cerebral white matter and pons are nonspecific but compatible with moderate chronic small vessel ischemic disease. Mild cerebral atrophy is within normal limits for age. No intracranial hemorrhage, mass, midline shift, or extra-axial fluid collection is identified. Vascular: Major intracranial vascular flow voids are preserved. Skull and upper cervical spine: Unremarkable bone marrow signal. Sinuses/Orbits: Unremarkable orbits. Moderate mucosal thickening in the left maxillary sinus. Small bilateral mastoid effusions. Other: None. MRA HEAD FINDINGS Anterior circulation: The internal carotid arteries are widely patent from skull base to carotid termini. The ACAs and MCAs are patent without evidence of a proximal branch occlusion or significant proximal stenosis. No aneurysm is identified. Posterior circulation: The included intracranial portions of the vertebral arteries are patent to the basilar with the left being strongly dominant. The basilar artery is widely patent. There are moderate-sized posterior communicating arteries bilaterally. Both PCAs are patent without evidence of a significant proximal stenosis. No aneurysm is identified. Anatomic variants: Duplicated middle cerebral arteries bilaterally. Hypoplastic right vertebral artery. MRA NECK FINDINGS The study is mildly motion degraded. Aortic arch: Normal variant 4 vessel aortic arch with the left vertebral artery arising from the arch. Widely patent arch vessel origins. Right carotid system: Patent without evidence of a significant stenosis or dissection. Left carotid system: Patent without evidence of a significant stenosis or dissection. Vertebral arteries: The vertebral arteries are patent with antegrade flow bilaterally and with the left being dominant. Motion artifact limits assessment of the vertebral artery origins, however there is no evidence of a significant stenosis or dissection on either side more distally. IMPRESSION: 1. Small acute  infarct involving the right insula. 2. Moderate chronic small vessel ischemic disease. 3. Negative head MRA. 4. Negative neck MRA within limitations of mild motion artifact. Electronically Signed   By: Sebastian Ache M.D.   On: 05/05/2021 15:11   MR BRAIN WO CONTRAST  Result Date: 05/05/2021 CLINICAL DATA:  Neuro deficit, acute, stroke suspected. Slurred speech. EXAM: MRI HEAD WITHOUT CONTRAST MRA HEAD WITHOUT CONTRAST MRA OF THE NECK WITHOUT AND WITH CONTRAST TECHNIQUE: Multiplanar, multi-echo pulse sequences of the brain and surrounding structures were acquired without intravenous contrast. Angiographic images of the Circle of Willis were acquired using MRA technique without intravenous contrast. Angiographic images of the neck were acquired using MRA technique without and with intravenous contrast. Carotid stenosis measurements (when applicable) are obtained utilizing NASCET criteria, using the distal internal carotid diameter as the denominator. CONTRAST:  7.68mL GADAVIST GADOBUTROL 1 MMOL/ML IV SOLN COMPARISON:  Head CT 05/05/2020 FINDINGS: MR HEAD FINDINGS Brain: There is a small acute infarct involving the right insula. Patchy T2 hyperintensities in the cerebral white matter and pons are nonspecific but compatible with moderate chronic small vessel ischemic disease. Mild cerebral atrophy is within normal limits for age. No intracranial hemorrhage, mass, midline shift, or extra-axial fluid collection is identified.  Vascular: Major intracranial vascular flow voids are preserved. Skull and upper cervical spine: Unremarkable bone marrow signal. Sinuses/Orbits: Unremarkable orbits. Moderate mucosal thickening in the left maxillary sinus. Small bilateral mastoid effusions. Other: None. MRA HEAD FINDINGS Anterior circulation: The internal carotid arteries are widely patent from skull base to carotid termini. The ACAs and MCAs are patent without evidence of a proximal branch occlusion or significant proximal  stenosis. No aneurysm is identified. Posterior circulation: The included intracranial portions of the vertebral arteries are patent to the basilar with the left being strongly dominant. The basilar artery is widely patent. There are moderate-sized posterior communicating arteries bilaterally. Both PCAs are patent without evidence of a significant proximal stenosis. No aneurysm is identified. Anatomic variants: Duplicated middle cerebral arteries bilaterally. Hypoplastic right vertebral artery. MRA NECK FINDINGS The study is mildly motion degraded. Aortic arch: Normal variant 4 vessel aortic arch with the left vertebral artery arising from the arch. Widely patent arch vessel origins. Right carotid system: Patent without evidence of a significant stenosis or dissection. Left carotid system: Patent without evidence of a significant stenosis or dissection. Vertebral arteries: The vertebral arteries are patent with antegrade flow bilaterally and with the left being dominant. Motion artifact limits assessment of the vertebral artery origins, however there is no evidence of a significant stenosis or dissection on either side more distally. IMPRESSION: 1. 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 2. Moderate chronic small vessel ischemic disease. 3. Negative head MRA. 4. Negative neck MRA within limitations of mild motion artifact. Electronically Signed   By: Sebastian Ache M.D.   On: 05/05/2021 15:11   ECHOCARDIOGRAM COMPLETE BUBBLE STUDY  Result Date: 05/06/2021    ECHOCARDIOGRAM REPORT   Patient Name:   KALLIOPE RIESEN Date of Exam: 05/06/2021 Medical Rec #:  [id number]      Height:       69.0 in Accession #:    [id number]     Weight:       157.8 lb Date of Birth:  01-13-1951      BSA:          1.868 m Patient Age:    69 years       BP:           133/62 mmHg Patient Gender: F              HR:           81 bpm. Exam Location:  Inpatient Procedure: 2D Echo, Cardiac Doppler, Color Doppler and Saline Contrast Bubble             Study Indications:    Stroke 434.91/I63.9  History:        Patient has no prior history of Echocardiogram examinations.                 Signs/Symptoms:Dizziness/Lightheadedness.  Sonographer:    Roosvelt Maser RDCS Referring Phys: [phone] Malachi Carl STACK IMPRESSIONS  1. Left ventricular ejection fraction, by estimation, is 60 to 65%. The left ventricle has normal function. The left ventricle has no regional wall motion abnormalities. Left ventricular diastolic parameters are consistent with Grade I diastolic dysfunction (impaired relaxation).  2. Right ventricular systolic function is normal. The right ventricular size is normal. There is normal pulmonary artery systolic pressure.  3. The mitral valve is normal in structure. Mild mitral valve regurgitation. No evidence of mitral stenosis.  4. The aortic valve is tricuspid. Aortic valve regurgitation is not visualized.  5. The inferior vena cava is normal  in size with greater than 50% respiratory variability, suggesting right atrial pressure of 3 mmHg. FINDINGS  Left Ventricle: Left ventricular ejection fraction, by estimation, is 60 to 65%. The left ventricle has normal function. The left ventricle has no regional wall motion abnormalities. The left ventricular internal cavity size was normal in size. There is  no left ventricular hypertrophy. Left ventricular diastolic parameters are consistent with Grade I diastolic dysfunction (impaired relaxation). Right Ventricle: The right ventricular size is normal. No increase in right ventricular wall thickness. Right ventricular systolic function is normal. There is normal pulmonary artery systolic pressure. Left Atrium: Left atrial size was normal in size. Right Atrium: Right atrial size was normal in size. Pericardium: There is no evidence of pericardial effusion. Mitral Valve: The mitral valve is normal in structure. Mild mitral valve regurgitation. No evidence of mitral valve stenosis. Tricuspid Valve: The tricuspid  valve is normal in structure. Tricuspid valve regurgitation is mild . No evidence of tricuspid stenosis. Aortic Valve: The aortic valve is tricuspid. Aortic valve regurgitation is not visualized. Aortic valve mean gradient measures 4.0 mmHg. Aortic valve peak gradient measures 8.0 mmHg. Aortic valve area, by VTI measures 2.25 cm. Pulmonic Valve: The pulmonic valve was not well visualized. Pulmonic valve regurgitation is not visualized. No evidence of pulmonic stenosis. Aorta: The aortic root is normal in size and structure. Venous: The inferior vena cava is normal in size with greater than 50% respiratory variability, suggesting right atrial pressure of 3 mmHg. IAS/Shunts: No atrial level shunt detected by color flow Doppler. Agitated saline contrast was given intravenously to evaluate for intracardiac shunting.  LEFT VENTRICLE PLAX 2D LVIDd:         4.50 cm  Diastology LVIDs:         2.90 cm  LV e' medial:    7.40 cm/s LV PW:         1.00 cm  LV E/e' medial:  9.0 LV IVS:        1.00 cm  LV e' lateral:   8.38 cm/s LVOT diam:     1.90 cm  LV E/e' lateral: 8.0 LV SV:         62 LV SV Index:   33 LVOT Area:     2.84 cm                          3D Volume EF:                         3D EF:        60 %                         LV EDV:       121 ml                         LV ESV:       48 ml                         LV SV:        73 ml RIGHT VENTRICLE RV Basal diam:  3.20 cm LEFT ATRIUM           Index       RIGHT ATRIUM           Index LA diam:  3.60 cm 1.93 cm/m  RA Area:     12.70 cm LA Vol (A2C): 40.7 ml 21.78 ml/m RA Volume:   28.00 ml  14.99 ml/m  AORTIC VALVE AV Area (Vmax):    2.23 cm AV Area (Vmean):   2.18 cm AV Area (VTI):     2.25 cm AV Vmax:           141.00 cm/s AV Vmean:          97.700 cm/s AV VTI:            0.274 m AV Peak Grad:      8.0 mmHg AV Mean Grad:      4.0 mmHg LVOT Vmax:         111.00 cm/s LVOT Vmean:        75.200 cm/s LVOT VTI:          0.217 m LVOT/AV VTI ratio: 0.79  AORTA Ao  Root diam: 3.10 cm Ao Asc diam:  3.20 cm MITRAL VALVE MV Area (PHT): 4.39 cm     SHUNTS MV Decel Time: 173 msec     Systemic VTI:  0.22 m MV E velocity: 66.80 cm/s   Systemic Diam: 1.90 cm MV A velocity: 139.00 cm/s MV E/A ratio:  0.48 Dina Rich MD Electronically signed by Dina Rich MD Signature Date/Time: 05/06/2021/1:24:17 PM    Final    VAS [place] LOWER EXTREMITY VENOUS (DVT)  Result Date: 05/06/2021  Lower Venous DVT Study Patient Name:  GEARL KIMBROUGH  Date of Exam:   05/06/2021 Medical Rec #: [id number]       Accession #:    [id number] Date of Birth: [birth year]       Patient Gender: F Patient Age:   69Y Exam Location:  [facility] Procedure:      VAS [place] LOWER EXTREMITY VENOUS (DVT) Referring Phys: [id number] Marvel Plan --------------------------------------------------------------------------------  Indications: Stroke.  Comparison Study: No previous exams Performing Technologist: Jody Hill RVT, RDMS  Examination Guidelines: A complete evaluation includes B-mode imaging, spectral Doppler, color Doppler, and power Doppler as needed of all accessible portions of each vessel. Bilateral testing is considered an integral part of a complete examination. Limited examinations for reoccurring indications may be performed as noted. The reflux portion of the exam is performed with the patient in reverse Trendelenburg.  +---------+---------------+---------+-----------+----------+----------------+ RIGHT    CompressibilityPhasicitySpontaneityPropertiesThrombus Aging   +---------+---------------+---------+-----------+----------+----------------+ CFV      Full           Yes      Yes                                   +---------+---------------+---------+-----------+----------+----------------+ SFJ      Full                                                          +---------+---------------+---------+-----------+----------+----------------+ FV Prox  Full           Yes      Yes                                    +---------+---------------+---------+-----------+----------+----------------+ FV Mid   Full  Yes      Yes                                   +---------+---------------+---------+-----------+----------+----------------+ FV DistalFull           Yes      Yes                                   +---------+---------------+---------+-----------+----------+----------------+ PFV      Full                                                          +---------+---------------+---------+-----------+----------+----------------+ POP      Full           Yes      Yes                                   +---------+---------------+---------+-----------+----------+----------------+ PTV      Full                                                          +---------+---------------+---------+-----------+----------+----------------+ PERO                                                  Not seen on exam +---------+---------------+---------+-----------+----------+----------------+   Right Technical Findings: Not visualized segments include peroneal veins.  +---------+---------------+---------+-----------+----------+-------------------+ LEFT     CompressibilityPhasicitySpontaneityPropertiesThrombus Aging      +---------+---------------+---------+-----------+----------+-------------------+ CFV      Full           Yes      Yes                                      +---------+---------------+---------+-----------+----------+-------------------+ SFJ      Full                                                             +---------+---------------+---------+-----------+----------+-------------------+ FV Prox  Full           Yes      Yes                                      +---------+---------------+---------+-----------+----------+-------------------+ FV Mid   Full           Yes      Yes                                       +---------+---------------+---------+-----------+----------+-------------------+  FV DistalFull           Yes      Yes                                      +---------+---------------+---------+-----------+----------+-------------------+ PFV      Full                                                             +---------+---------------+---------+-----------+----------+-------------------+ POP      Full           Yes      Yes                                      +---------+---------------+---------+-----------+----------+-------------------+ PTV      Full                                                             +---------+---------------+---------+-----------+----------+-------------------+ PERO     Full                                         Not well visualized +---------+---------------+---------+-----------+----------+-------------------+     Summary: BILATERAL: - No evidence of deep vein thrombosis seen in the lower extremities, bilaterally. - No evidence of superficial venous thrombosis in the lower extremities, bilaterally. -No evidence of popliteal cyst, bilaterally.   *See table(s) above for measurements and observations.    Preliminary         Scheduled Meds:  aspirin EC  81 mg Oral Daily   atorvastatin  20 mg Oral Daily   clopidogrel  75 mg Oral Daily   clotrimazole   Topical BID   diltiazem  120 mg Oral BID   enoxaparin (LOVENOX) injection  40 mg Subcutaneous Q24H   flecainide  50 mg Oral BID   fluticasone furoate-vilanterol  1 puff Inhalation Daily   And   umeclidinium bromide  1 puff Inhalation Daily   leflunomide  20 mg Oral Daily   lidocaine  1 patch Transdermal Q24H   metoprolol succinate  25 mg Oral Daily   nystatin   Topical BID   pantoprazole  40 mg Oral Daily   predniSONE  5 mg Oral Q breakfast     LOS: 1 day    Time spent: 35 minutes    Zaela Graley Hoover Brunette, DO [facility]  If 7PM-7AM, please contact  night-coverage [email or web address] 05/07/2021, 1:12 PM

## 2021-05-07 NOTE — Evaluation (Addendum)
Occupational Therapy Evaluation Patient Details Name: Sandra Nielsen MRN: [id number] DOB: [birth year] Today's Date: 05/07/2021    History of Present Illness 70 yo female admitted with slurred speech L mouth droop and drooling. MRI acute R insula infarct  PMH breast CA s/p R mastectomy chronic bronchitis RA HLD tobacco abuse COVID pelvis fx(7 weeks ago June 2022)  wear glasses   Clinical Impression   PTA, pt lived alone and used a RW for mobility; daughter stays with pt often and visits daily; daughter supervises shower transfers at baseline. Currently, pt performing UB ADL with set up and LB ADL with Min Guard A. Pt performing functional mobility with Min Guard A using RW. Pt presents with decreased strength, balance, and activity tolerance. Recommend discharge home with no follow up OT and will continue to follow acutely as admitted.   HR 117 max with grooming; 110 max with functional mobility. Pt HR increasing to 220 BPM seated at end of session. Pt with no symptoms, but reporting she could feel that he heart rate was high. Nursing notified and in room upon departure. BP 151/126 (135).     Follow Up Recommendations  No OT follow up    Equipment Recommendations  None recommended by OT    Recommendations for Other Services       Precautions / Restrictions Precautions Precautions: Fall Restrictions Weight Bearing Restrictions: No      Mobility Bed Mobility               General bed mobility comments: Pt sitting in long sit position on arrival with HOB flat. Supervision to pivot towards EOB    Transfers Overall transfer level: Needs assistance Equipment used: Rolling walker (2 wheeled) Transfers: Sit to/from Stand Sit to Stand: Min guard         General transfer comment: Min Guard A for safety    Balance Overall balance assessment: Mild deficits observed, not formally tested                                         ADL either performed or  assessed with clinical judgement   ADL Overall ADL's : Needs assistance/impaired Eating/Feeding: Set up;Sitting   Grooming: Oral care;Wash/dry face;Min guard;Standing Grooming Details (indicate cue type and reason): Pt performing oral care and washing face with min Guard A for safety Upper Body Bathing: Supervision/ safety;Sitting;Set up   Lower Body Bathing: Min guard;Sit to/from stand   Upper Body Dressing : Set up;Sitting   Lower Body Dressing: Min guard;Sit to/from stand   Toilet Transfer: Solicitor;Ambulation;RW   Toileting- Architect and Hygiene: Min guard;Sit to/from stand       Functional mobility during ADLs: Min guard;Rolling walker General ADL Comments: Pt performing UB ADL with set up and LB ADL with min Guard.     Vision Baseline Vision/History: Wears glasses Patient Visual Report: No change from baseline Additional Comments: Pt reporting she wears glasses on an as needed basis     Perception Perception Perception Tested?: No Comments: no apparent difficulty with perception   Praxis Praxis Praxis tested?: Not tested Praxis-Other Comments: no apparent difficulty with motor planning.    Pertinent Vitals/Pain Pain Assessment: Faces Faces Pain Scale: Hurts a little bit Pain Location: neck Pain Descriptors / Indicators: Discomfort Pain Intervention(s): Limited activity within patient's tolerance;Monitored during session     Hand Dominance Right   Extremity/Trunk Assessment Upper  Extremity Assessment Upper Extremity Assessment: Generalized weakness;LUE deficits/detail;RUE deficits/detail RUE Deficits / Details: Pt with decreased precision with finger opposition LUE Deficits / Details: LUE with decreased grip strength as compared to RUE. Decreased precision with LUE during fiunger opposition LUE Coordination: decreased fine motor   Lower Extremity Assessment Lower Extremity Assessment: Defer to PT evaluation   Cervical / Trunk  Assessment Cervical / Trunk Assessment: Kyphotic (shoulders rounded forward)   Communication Communication Communication: No difficulties   Cognition Arousal/Alertness: Awake/alert Behavior During Therapy: WFL for tasks assessed/performed Overall Cognitive Status: Within Functional Limits for tasks assessed                                 General Comments: Pt pleasant and conversational throughout session   General Comments  HR 117 max with and grooming; 110 max with functional mobility. Pt HR increasing to 220 BPM with seated rest break, nursing notified. BP 151/126 (135)    Exercises     Shoulder Instructions      Home Living Family/patient expects to be discharged to:: Private residence Living Arrangements: Alone Available Help at Discharge: Family Type of Home: House Home Access: Stairs to enter Secretary/administrator of Steps: 2 Entrance Stairs-Rails: None Home Layout: One level     Bathroom Shower/Tub: Chief Strategy Officer: Standard     Home Equipment: Tub bench;Bedside commode;Walker - 2 wheels      Lives With: Daughter (daughter visits every day and stays overnight several days)    Prior Functioning/Environment Level of Independence: Needs assistance  Gait / Transfers Assistance Needed: RW ADL's / Homemaking Assistance Needed: Daughter provides supervision when pt transfers into and out of shower   Comments: daughter stays with her often and visits every day        OT Problem List: Decreased strength;Decreased activity tolerance;Impaired balance (sitting and/or standing);Decreased coordination;Pain      OT Treatment/Interventions:      OT Goals(Current goals can be found in the care plan section) Acute Rehab OT Goals Patient Stated Goal: get HR back under control OT Goal Formulation: With patient Time For Goal Achievement: 05/07/21 Potential to Achieve Goals: Good  OT Frequency: Min 2X/week   Barriers to D/C:             Co-evaluation PT/OT/SLP Co-Evaluation/Treatment: Yes Reason for Co-Treatment: Complexity of the patient's impairments (multi-system involvement) PT goals addressed during session: Mobility/safety with mobility;Balance OT goals addressed during session: ADL's and self-care;Proper use of Adaptive equipment and DME      AM-PAC OT "6 Clicks" Daily Activity     Outcome Measure Help from another person eating meals?: A Little Help from another person taking care of personal grooming?: A Little Help from another person toileting, which includes using toliet, bedpan, or urinal?: A Little Help from another person bathing (including washing, rinsing, drying)?: A Little Help from another person to put on and taking off regular upper body clothing?: A Little Help from another person to put on and taking off regular lower body clothing?: A Little 6 Click Score: 18   End of Session Equipment Utilized During Treatment: Gait belt;Rolling walker Nurse Communication: Mobility status  Activity Tolerance: Patient tolerated treatment well Patient left: in chair;with call bell/phone within reach;with chair alarm set;with nursing/sitter in room  OT Visit Diagnosis: Unsteadiness on feet (R26.81);Muscle weakness (generalized) (M62.81);Pain Pain - part of body:  (neck)  Time: 1010-1042 OT Time Calculation (min): 32 min Charges:  OT General Charges $OT Visit: 1 Visit OT Evaluation $OT Eval Moderate Complexity: 1 Mod  Ladene Artist, OTDS   Ladene Artist 05/07/2021, 10:58 AM

## 2021-05-07 NOTE — Progress Notes (Addendum)
Pt's HR 190s SVT asymptomatic. Per telemetry SVT, Dr. Loney Loh informed. Pt now Afib. Same MD updated.

## 2021-05-07 NOTE — Plan of Care (Signed)
  Problem: Education: Goal: Knowledge of disease or condition will improve Outcome: Progressing Goal: Knowledge of secondary prevention will improve Outcome: Progressing Goal: Knowledge of patient specific risk factors addressed and post discharge goals established will improve Outcome: Progressing   Problem: Coping: Goal: Will identify appropriate support needs Outcome: Progressing   Problem: Self-Care: Goal: Ability to participate in self-care as condition permits will improve Outcome: Progressing Goal: Ability to communicate needs accurately will improve Outcome: Progressing   Problem: Education: Goal: Knowledge of General Education information will improve Description: Including pain rating scale, medication(s)/side effects and non-pharmacologic comfort measures Outcome: Progressing   Problem: Health Behavior/Discharge Planning: Goal: Ability to manage health-related needs will improve Outcome: Progressing   Problem: Clinical Measurements: Goal: Ability to maintain clinical measurements within normal limits will improve Outcome: Progressing Goal: Will remain free from infection Outcome: Progressing Goal: Diagnostic test results will improve Outcome: Progressing Goal: Respiratory complications will improve Outcome: Progressing Goal: Cardiovascular complication will be avoided Outcome: Progressing   Problem: Activity: Goal: Risk for activity intolerance will decrease Outcome: Progressing   Problem: Nutrition: Goal: Adequate nutrition will be maintained Outcome: Progressing   Problem: Coping: Goal: Level of anxiety will decrease Outcome: Progressing   Problem: Elimination: Goal: Will not experience complications related to bowel motility Outcome: Progressing Goal: Will not experience complications related to urinary retention Outcome: Progressing   Problem: Pain Managment: Goal: General experience of comfort will improve Outcome: Progressing   Problem:  Safety: Goal: Ability to remain free from injury will improve Outcome: Progressing   Problem: Skin Integrity: Goal: Risk for impaired skin integrity will decrease Outcome: Progressing

## 2021-05-07 NOTE — Consult Note (Signed)
Cardiology Consultation:   Patient ID: Sandra Nielsen MRN: [id number]; DOB: [birth year]  Admit date: 05/05/2021 Date of Consult: 05/07/2021  PCP:  Shellia Cleverly, PA   [facility] Providers Cardiologist:    Clydie Braun, MD   Patient Profile:   Sandra Nielsen is a 70 y.o. female with a hx of SVT who is being seen 05/07/2021 for the evaluation of recurrent SVT at the request of Dr. Sherryll Burger.  History of Present Illness:   Sandra Nielsen is a very pleasant 70 year old woman with a long history of SVT who has been treated with multiple medications including flecainide, who was admitted to the hospital after sustaining a stroke.  In the hospital she has had recurrent episodes of SVT despite being treated with flecainide as well as a calcium channel blocker, as well as a beta-blocker.  In SVT, she developed hypotension and this has made the treatment of her stroke much more difficult.  She denies a history of syncope.  She has been treated for her SVT for years.  It appears that she has refused catheter ablation.   Past Medical History:  Diagnosis Date   Female cystocele    History of breast cancer    S/P RIGHT MASTECTOMY / NO CHEMORADIATION/   NO RECURRENCE   History of chronic bronchitis    Smokers' cough (HCC)    Wears glasses     Past Surgical History:  Procedure Laterality Date   CYSTOCELE REPAIR N/A 09/27/2013   Procedure: BOSTON SCIENTIFIC UPHOLD LITE SACROSPINOUS LIGAMENT REPAIR , Rectocele Repair with Xenform, Cystocele repair with Xenform, Vaginal Vault Suspension, Joelene Millin;  Surgeon: Kathi Ludwig, MD;  Location: [facility];  Service: Urology;  Laterality: N/A;   LAPAROTOMY W/ BILATERAL SALPINGOOPHORECTOMY  JAN 1996   LEFT ANKLE RECONSTRUCTION  10-06-1979   MASTECTOMY WITH AXILLARY LYMPH NODE DISSECTION Right 02-16-1993   VAGINAL HYSTERECTOMY  1981       Inpatient Medications: Scheduled Meds:  aspirin EC  81 mg Oral Daily   atorvastatin   20 mg Oral Daily   clopidogrel  75 mg Oral Daily   clotrimazole   Topical BID   diltiazem  120 mg Oral BID   enoxaparin (LOVENOX) injection  40 mg Subcutaneous Q24H   flecainide  50 mg Oral BID   fluticasone furoate-vilanterol  1 puff Inhalation Daily   And   umeclidinium bromide  1 puff Inhalation Daily   leflunomide  20 mg Oral Daily   lidocaine  1 patch Transdermal Q24H   metoprolol succinate  25 mg Oral Daily   nystatin   Topical BID   pantoprazole  40 mg Oral Daily   predniSONE  5 mg Oral Q breakfast   Continuous Infusions:  PRN Meds: acetaminophen **OR** acetaminophen (TYLENOL) oral liquid 160 mg/5 mL **OR** acetaminophen, levalbuterol, metoprolol tartrate  Allergies:    Allergies  Allergen Reactions   Meloxicam Swelling    Other reaction(s): Swelling    Penicillins Hives and Swelling   Levofloxacin Palpitations    Social History:   Social History   Socioeconomic History   Marital status: Widowed    Spouse name: Not on file   Number of children: 3   Years of education: Not on file   Highest education level: Not on file  Occupational History   Occupation: Retired  Tobacco Use   Smoking status: Every Day    Packs/day: 1.00    Years: 40.00    Pack years: 40.00    Types: Cigarettes  Smokeless tobacco: Never  Vaping Use   Vaping Use: Never used  Substance and Sexual Activity   Alcohol use: No   Drug use: No   Sexual activity: Not on file  Other Topics Concern   Not on file  Social History Narrative   Not on file   Social Determinants of Health   Financial Resource Strain: Not on file  Food Insecurity: Not on file  Transportation Needs: Not on file  Physical Activity: Not on file  Stress: Not on file  Social Connections: Not on file  Intimate Partner Violence: Not on file    Family History:   History reviewed. No pertinent family history.   ROS:  Please see the history of present illness.   All other ROS reviewed and negative.      Physical Exam/Data:   Vitals:   05/07/21 0802 05/07/21 0806 05/07/21 0902 05/07/21 1121  BP: 113/60     Pulse: 89 90    Resp: 13 19    Temp:    98.5 F (36.9 C)  TempSrc:      SpO2: 96% 94% 97%   Weight:      Height:       No intake or output data in the 24 hours ending 05/07/21 1458 Last 3 Weights 05/05/2021 09/27/2013 09/23/2013  Weight (lbs) 157 lb 13.6 oz 172 lb 8 oz 164 lb  Weight (kg) 71.6 kg 78.245 kg 74.39 kg     Body mass index is 23.31 kg/m.  General:  Well nourished, well developed, in no acute distress HEENT: normal Lymph: no adenopathy Neck: no JVD Endocrine:  No thryomegaly Vascular: No carotid bruits; FA pulses 2+ bilaterally without bruits  Cardiac:  normal S1, S2; RRR; no murmur  Lungs:  clear to auscultation bilaterally, no wheezing, rhonchi or rales  Abd: soft, nontender, no hepatomegaly  Ext: no edema Musculoskeletal:  No deformities, BUE and BLE strength normal and equal Skin: warm and dry  Neuro:  CNs 2-12 intact, no focal abnormalities noted Psych:  Normal affect   EKG:  The EKG was personally reviewed and demonstrates: SVT, and in normal rhythm there is no ventricular preexcitation Telemetry:  Telemetry was personally reviewed and demonstrates: SVT and normal sinus rhythm  Relevant CV Studies: 2D echo reviewed  Laboratory Data:  High Sensitivity Troponin:  No results for input(s): TROPONINIHS in the last 720 hours.   Chemistry Recent Labs  Lab 05/05/21 1211 05/05/21 1219 05/07/21 1233  NA 135 135 135  K 3.6 3.9 3.9  CL 98 98 100  CO2 26  --  25  GLUCOSE 172* 168* 103*  BUN 10 11 7*  CREATININE 0.90 0.70 0.83  CALCIUM 9.7  --  9.4  GFRNONAA >60  --  >60  ANIONGAP 11  --  10    Recent Labs  Lab 05/05/21 1211  PROT 6.5  ALBUMIN 3.3*  AST 23  ALT 19  ALKPHOS 132*  BILITOT 0.7   Hematology Recent Labs  Lab 05/05/21 1211 05/05/21 1219  WBC 9.9  --   RBC 3.49*  --   HGB 12.6 13.3  HCT 38.0 39.0  MCV 108.9*  --   MCH  36.1*  --   MCHC 33.2  --   RDW 16.0*  --   PLT 293  --    BNPNo results for input(s): BNP, PROBNP in the last 168 hours.  DDimer No results for input(s): DDIMER in the last 168 hours.   Radiology/Studies:  DG Cervical  Spine 2 or 3 views  Result Date: 05/05/2021 CLINICAL DATA:  Neck pain on the right side. EXAM: CERVICAL SPINE - 2-3 VIEW COMPARISON:  None. FINDINGS: There is no evidence of cervical spine fracture or prevertebral soft tissue swelling. 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 There are moderate to severe degenerative changes from C3-4 to C6-7. There is an anterior wedge deformity of the C4 vertebral body with less than 25% height loss which appears chronic. 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 No acute osseous injury. Electronically Signed   By: Romona Curls M.D.   On: 05/05/2021 16:19   MR ANGIO HEAD WO CONTRAST  Result Date: 05/05/2021 CLINICAL DATA:  Neuro deficit, acute, stroke suspected. Slurred speech. EXAM: MRI HEAD WITHOUT CONTRAST MRA HEAD WITHOUT CONTRAST MRA OF THE NECK WITHOUT AND WITH CONTRAST TECHNIQUE: Multiplanar, multi-echo pulse sequences of the brain and surrounding structures were acquired without intravenous contrast. Angiographic images of the Circle of Willis were acquired using MRA technique without intravenous contrast. Angiographic images of the neck were acquired using MRA technique without and with intravenous contrast. Carotid stenosis measurements (when applicable) are obtained utilizing NASCET criteria, using the distal internal carotid diameter as the denominator. CONTRAST:  7.73mL GADAVIST GADOBUTROL 1 MMOL/ML IV SOLN COMPARISON:  Head CT 05/05/2020 FINDINGS: MR HEAD FINDINGS Brain: There is a small acute infarct involving the right insula. Patchy T2 hyperintensities in the cerebral white matter and pons are nonspecific but compatible with moderate chronic small vessel ischemic disease. Mild cerebral atrophy  is within normal limits for age. No intracranial hemorrhage, mass, midline shift, or extra-axial fluid collection is identified. Vascular: Major intracranial vascular flow voids are preserved. Skull and upper cervical spine: Unremarkable bone marrow signal. Sinuses/Orbits: Unremarkable orbits. Moderate mucosal thickening in the left maxillary sinus. Small bilateral mastoid effusions. Other: None. MRA HEAD FINDINGS Anterior circulation: The internal carotid arteries are widely patent from skull base to carotid termini. The ACAs and MCAs are patent without evidence of a proximal branch occlusion or significant proximal stenosis. No aneurysm is identified. Posterior circulation: The included intracranial portions of the vertebral arteries are patent to the basilar with the left being strongly dominant. The basilar artery is widely patent. There are moderate-sized posterior communicating arteries bilaterally. Both PCAs are patent without evidence of a significant proximal stenosis. No aneurysm is identified. Anatomic variants: Duplicated middle cerebral arteries bilaterally. Hypoplastic right vertebral artery. MRA NECK FINDINGS The study is mildly motion degraded. Aortic arch: Normal variant 4 vessel aortic arch with the left vertebral artery arising from the arch. Widely patent arch vessel origins. Right carotid system: Patent without evidence of a significant stenosis or dissection. Left carotid system: Patent without evidence of a significant stenosis or dissection. Vertebral arteries: The vertebral arteries are patent with antegrade flow bilaterally and with the left being dominant. Motion artifact limits assessment of the vertebral artery origins, however there is no evidence of a significant stenosis or dissection on either side more distally. IMPRESSION: 1. 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 2. Moderate chronic small vessel ischemic disease. 3. Negative head MRA. 4. Negative neck MRA within  limitations of mild motion artifact. Electronically Signed   By: Sebastian Ache M.D.   On: 05/05/2021 15:11   MR ANGIO NECK W WO CONTRAST  Result Date: 05/05/2021 CLINICAL DATA:  Neuro deficit, acute, stroke suspected. Slurred speech. EXAM: MRI HEAD WITHOUT CONTRAST MRA HEAD WITHOUT CONTRAST MRA OF THE NECK WITHOUT AND WITH CONTRAST TECHNIQUE: Multiplanar, multi-echo  pulse sequences of the brain and surrounding structures were acquired without intravenous contrast. Angiographic images of the Circle of Willis were acquired using MRA technique without intravenous contrast. Angiographic images of the neck were acquired using MRA technique without and with intravenous contrast. Carotid stenosis measurements (when applicable) are obtained utilizing NASCET criteria, using the distal internal carotid diameter as the denominator. CONTRAST:  7.52mL GADAVIST GADOBUTROL 1 MMOL/ML IV SOLN COMPARISON:  Head CT 05/05/2020 FINDINGS: MR HEAD FINDINGS Brain: There is a small acute infarct involving the right insula. Patchy T2 hyperintensities in the cerebral white matter and pons are nonspecific but compatible with moderate chronic small vessel ischemic disease. Mild cerebral atrophy is within normal limits for age. No intracranial hemorrhage, mass, midline shift, or extra-axial fluid collection is identified. Vascular: Major intracranial vascular flow voids are preserved. Skull and upper cervical spine: Unremarkable bone marrow signal. Sinuses/Orbits: Unremarkable orbits. Moderate mucosal thickening in the left maxillary sinus. Small bilateral mastoid effusions. Other: None. MRA HEAD FINDINGS Anterior circulation: The internal carotid arteries are widely patent from skull base to carotid termini. The ACAs and MCAs are patent without evidence of a proximal branch occlusion or significant proximal stenosis. No aneurysm is identified. Posterior circulation: The included intracranial portions of the vertebral arteries are patent to  the basilar with the left being strongly dominant. The basilar artery is widely patent. There are moderate-sized posterior communicating arteries bilaterally. Both PCAs are patent without evidence of a significant proximal stenosis. No aneurysm is identified. Anatomic variants: Duplicated middle cerebral arteries bilaterally. Hypoplastic right vertebral artery. MRA NECK FINDINGS The study is mildly motion degraded. Aortic arch: Normal variant 4 vessel aortic arch with the left vertebral artery arising from the arch. Widely patent arch vessel origins. Right carotid system: Patent without evidence of a significant stenosis or dissection. Left carotid system: Patent without evidence of a significant stenosis or dissection. Vertebral arteries: The vertebral arteries are patent with antegrade flow bilaterally and with the left being dominant. Motion artifact limits assessment of the vertebral artery origins, however there is no evidence of a significant stenosis or dissection on either side more distally. IMPRESSION: 1. 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 2. Moderate chronic small vessel ischemic disease. 3. Negative head MRA. 4. Negative neck MRA within limitations of mild motion artifact. Electronically Signed   By: Sebastian Ache M.D.   On: 05/05/2021 15:11   MR BRAIN WO CONTRAST  Result Date: 05/05/2021 CLINICAL DATA:  Neuro deficit, acute, stroke suspected. Slurred speech. EXAM: MRI HEAD WITHOUT CONTRAST MRA HEAD WITHOUT CONTRAST MRA OF THE NECK WITHOUT AND WITH CONTRAST TECHNIQUE: Multiplanar, multi-echo pulse sequences of the brain and surrounding structures were acquired without intravenous contrast. Angiographic images of the Circle of Willis were acquired using MRA technique without intravenous contrast. Angiographic images of the neck were acquired using MRA technique without and with intravenous contrast. Carotid stenosis measurements (when applicable) are obtained utilizing NASCET criteria,  using the distal internal carotid diameter as the denominator. CONTRAST:  7.36mL GADAVIST GADOBUTROL 1 MMOL/ML IV SOLN COMPARISON:  Head CT 05/05/2020 FINDINGS: MR HEAD FINDINGS Brain: There is a small acute infarct involving the right insula. Patchy T2 hyperintensities in the cerebral white matter and pons are nonspecific but compatible with moderate chronic small vessel ischemic disease. Mild cerebral atrophy is within normal limits for age. No intracranial hemorrhage, mass, midline shift, or extra-axial fluid collection is identified. Vascular: Major intracranial vascular flow voids are preserved. Skull and upper cervical spine: Unremarkable bone marrow signal. Sinuses/Orbits:  Unremarkable orbits. Moderate mucosal thickening in the left maxillary sinus. Small bilateral mastoid effusions. Other: None. MRA HEAD FINDINGS Anterior circulation: The internal carotid arteries are widely patent from skull base to carotid termini. The ACAs and MCAs are patent without evidence of a proximal branch occlusion or significant proximal stenosis. No aneurysm is identified. Posterior circulation: The included intracranial portions of the vertebral arteries are patent to the basilar with the left being strongly dominant. The basilar artery is widely patent. There are moderate-sized posterior communicating arteries bilaterally. Both PCAs are patent without evidence of a significant proximal stenosis. No aneurysm is identified. Anatomic variants: Duplicated middle cerebral arteries bilaterally. Hypoplastic right vertebral artery. MRA NECK FINDINGS The study is mildly motion degraded. Aortic arch: Normal variant 4 vessel aortic arch with the left vertebral artery arising from the arch. Widely patent arch vessel origins. Right carotid system: Patent without evidence of a significant stenosis or dissection. Left carotid system: Patent without evidence of a significant stenosis or dissection. Vertebral arteries: The vertebral arteries  are patent with antegrade flow bilaterally and with the left being dominant. Motion artifact limits assessment of the vertebral artery origins, however there is no evidence of a significant stenosis or dissection on either side more distally. IMPRESSION: 1. 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 2. Moderate chronic small vessel ischemic disease. 3. Negative head MRA. 4. Negative neck MRA within limitations of mild motion artifact. Electronically Signed   By: Sebastian Ache M.D.   On: 05/05/2021 15:11   ECHOCARDIOGRAM COMPLETE BUBBLE STUDY  Result Date: 05/06/2021    ECHOCARDIOGRAM REPORT   Patient Name:   Sandra Nielsen Date of Exam: 05/06/2021 Medical Rec #:  [id number]      Height:       69.0 in Accession #:    [id number]     Weight:       157.8 lb Date of Birth:  1951-05-16      BSA:          1.868 m Patient Age:    69 years       BP:           133/62 mmHg Patient Gender: F              HR:           81 bpm. Exam Location:  Inpatient Procedure: 2D Echo, Cardiac Doppler, Color Doppler and Saline Contrast Bubble            Study Indications:    Stroke 434.91/I63.9  History:        Patient has no prior history of Echocardiogram examinations.                 Signs/Symptoms:Dizziness/Lightheadedness.  Sonographer:    Roosvelt Maser RDCS Referring Phys: [phone] Malachi Carl STACK IMPRESSIONS  1. Left ventricular ejection fraction, by estimation, is 60 to 65%. The left ventricle has normal function. The left ventricle has no regional wall motion abnormalities. Left ventricular diastolic parameters are consistent with Grade I diastolic dysfunction (impaired relaxation).  2. Right ventricular systolic function is normal. The right ventricular size is normal. There is normal pulmonary artery systolic pressure.  3. The mitral valve is normal in structure. Mild mitral valve regurgitation. No evidence of mitral stenosis.  4. The aortic valve is tricuspid. Aortic valve regurgitation is not visualized.  5. The inferior vena  cava is normal in size with greater than 50% respiratory variability, suggesting right atrial pressure of 3 mmHg. FINDINGS  Left  Ventricle: Left ventricular ejection fraction, by estimation, is 60 to 65%. The left ventricle has normal function. The left ventricle has no regional wall motion abnormalities. The left ventricular internal cavity size was normal in size. There is  no left ventricular hypertrophy. Left ventricular diastolic parameters are consistent with Grade I diastolic dysfunction (impaired relaxation). Right Ventricle: The right ventricular size is normal. No increase in right ventricular wall thickness. Right ventricular systolic function is normal. There is normal pulmonary artery systolic pressure. Left Atrium: Left atrial size was normal in size. Right Atrium: Right atrial size was normal in size. Pericardium: There is no evidence of pericardial effusion. Mitral Valve: The mitral valve is normal in structure. Mild mitral valve regurgitation. No evidence of mitral valve stenosis. Tricuspid Valve: The tricuspid valve is normal in structure. Tricuspid valve regurgitation is mild . No evidence of tricuspid stenosis. Aortic Valve: The aortic valve is tricuspid. Aortic valve regurgitation is not visualized. Aortic valve mean gradient measures 4.0 mmHg. Aortic valve peak gradient measures 8.0 mmHg. Aortic valve area, by VTI measures 2.25 cm. Pulmonic Valve: The pulmonic valve was not well visualized. Pulmonic valve regurgitation is not visualized. No evidence of pulmonic stenosis. Aorta: The aortic root is normal in size and structure. Venous: The inferior vena cava is normal in size with greater than 50% respiratory variability, suggesting right atrial pressure of 3 mmHg. IAS/Shunts: No atrial level shunt detected by color flow Doppler. Agitated saline contrast was given intravenously to evaluate for intracardiac shunting.  LEFT VENTRICLE PLAX 2D LVIDd:         4.50 cm  Diastology LVIDs:         2.90  cm  LV e' medial:    7.40 cm/s LV PW:         1.00 cm  LV E/e' medial:  9.0 LV IVS:        1.00 cm  LV e' lateral:   8.38 cm/s LVOT diam:     1.90 cm  LV E/e' lateral: 8.0 LV SV:         62 LV SV Index:   33 LVOT Area:     2.84 cm                          3D Volume EF:                         3D EF:        60 %                         LV EDV:       121 ml                         LV ESV:       48 ml                         LV SV:        73 ml RIGHT VENTRICLE RV Basal diam:  3.20 cm LEFT ATRIUM           Index       RIGHT ATRIUM           Index LA diam:      3.60 cm 1.93 cm/m  RA Area:     12.70 cm LA Vol (A2C):  40.7 ml 21.78 ml/m RA Volume:   28.00 ml  14.99 ml/m  AORTIC VALVE AV Area (Vmax):    2.23 cm AV Area (Vmean):   2.18 cm AV Area (VTI):     2.25 cm AV Vmax:           141.00 cm/s AV Vmean:          97.700 cm/s AV VTI:            0.274 m AV Peak Grad:      8.0 mmHg AV Mean Grad:      4.0 mmHg LVOT Vmax:         111.00 cm/s LVOT Vmean:        75.200 cm/s LVOT VTI:          0.217 m LVOT/AV VTI ratio: 0.79  AORTA Ao Root diam: 3.10 cm Ao Asc diam:  3.20 cm MITRAL VALVE MV Area (PHT): 4.39 cm     SHUNTS MV Decel Time: 173 msec     Systemic VTI:  0.22 m MV E velocity: 66.80 cm/s   Systemic Diam: 1.90 cm MV A velocity: 139.00 cm/s MV E/A ratio:  0.48 Dina Rich MD Electronically signed by Dina Rich MD Signature Date/Time: 05/06/2021/1:24:17 PM    Final    CT HEAD CODE STROKE WO CONTRAST  Result Date: 05/05/2021 CLINICAL DATA:  Code stroke. Neuro deficit, acute, stroke suspected. Left facial droop and slurred speech. EXAM: CT HEAD WITHOUT CONTRAST TECHNIQUE: Contiguous axial images were obtained from the base of the skull through the vertex without intravenous contrast. COMPARISON:  None. FINDINGS: Brain: There is no evidence of an acute infarct, intracranial hemorrhage, mass, midline shift, or extra-axial fluid collection. Hypodensities in the cerebral white matter bilaterally are nonspecific  but compatible with mild chronic small vessel ischemic disease. Mild cerebral atrophy is within normal limits for age. Vascular: Calcified atherosclerosis at the skull base. No hyperdense vessel. Skull: No fracture or suspicious osseous lesion. Sinuses/Orbits: Moderate mucosal thickening in the left maxillary sinus. Clear mastoid air cells. Unremarkable orbits. Other: None. ASPECTS [facility] Stroke Program Early CT Score) - Ganglionic level infarction (caudate, lentiform nuclei, internal capsule, insula, M1-M3 cortex): 7 - Supraganglionic infarction (M4-M6 cortex): 3 Total score (0-10 with 10 being normal): 10 IMPRESSION: 1. No evidence of acute intracranial abnormality. 2. ASPECTS is 10. 3. Mild chronic small vessel ischemic disease. These results were communicated to Dr. Selina Cooley at 12:27 pm on 05/05/2021 by text page via the [facility] messaging system. Electronically Signed   By: Sebastian Ache M.D.   On: 05/05/2021 12:27   VAS [place] LOWER EXTREMITY VENOUS (DVT)  Result Date: 05/06/2021  Lower Venous DVT Study Patient Name:  Sandra Nielsen  Date of Exam:   05/06/2021 Medical Rec #: [id number]       Accession #:    [id number] Date of Birth: [birth year]       Patient Gender: F Patient Age:   69Y Exam Location:  [facility] Procedure:      VAS [place] LOWER EXTREMITY VENOUS (DVT) Referring Phys: [id number] Marvel Plan --------------------------------------------------------------------------------  Indications: Stroke.  Comparison Study: No previous exams Performing Technologist: Jody Hill RVT, RDMS  Examination Guidelines: A complete evaluation includes B-mode imaging, spectral Doppler, color Doppler, and power Doppler as needed of all accessible portions of each vessel. Bilateral testing is considered an integral part of a complete examination. Limited examinations for reoccurring indications may be performed as noted. The reflux portion of the exam is performed with the  patient in reverse Trendelenburg.   +---------+---------------+---------+-----------+----------+----------------+ RIGHT    CompressibilityPhasicitySpontaneityPropertiesThrombus Aging   +---------+---------------+---------+-----------+----------+----------------+ CFV      Full           Yes      Yes                                   +---------+---------------+---------+-----------+----------+----------------+ SFJ      Full                                                          +---------+---------------+---------+-----------+----------+----------------+ FV Prox  Full           Yes      Yes                                   +---------+---------------+---------+-----------+----------+----------------+ FV Mid   Full           Yes      Yes                                   +---------+---------------+---------+-----------+----------+----------------+ FV DistalFull           Yes      Yes                                   +---------+---------------+---------+-----------+----------+----------------+ PFV      Full                                                          +---------+---------------+---------+-----------+----------+----------------+ POP      Full           Yes      Yes                                   +---------+---------------+---------+-----------+----------+----------------+ PTV      Full                                                          +---------+---------------+---------+-----------+----------+----------------+ PERO                                                  Not seen on exam +---------+---------------+---------+-----------+----------+----------------+   Right Technical Findings: Not visualized segments include peroneal veins.  +---------+---------------+---------+-----------+----------+-------------------+ LEFT     CompressibilityPhasicitySpontaneityPropertiesThrombus Aging       +---------+---------------+---------+-----------+----------+-------------------+ CFV      Full           Yes  Yes                                      +---------+---------------+---------+-----------+----------+-------------------+ SFJ      Full                                                             +---------+---------------+---------+-----------+----------+-------------------+ FV Prox  Full           Yes      Yes                                      +---------+---------------+---------+-----------+----------+-------------------+ FV Mid   Full           Yes      Yes                                      +---------+---------------+---------+-----------+----------+-------------------+ FV DistalFull           Yes      Yes                                      +---------+---------------+---------+-----------+----------+-------------------+ PFV      Full                                                             +---------+---------------+---------+-----------+----------+-------------------+ POP      Full           Yes      Yes                                      +---------+---------------+---------+-----------+----------+-------------------+ PTV      Full                                                             +---------+---------------+---------+-----------+----------+-------------------+ PERO     Full                                         Not well visualized +---------+---------------+---------+-----------+----------+-------------------+     Summary: BILATERAL: - No evidence of deep vein thrombosis seen in the lower extremities, bilaterally. - No evidence of superficial venous thrombosis in the lower extremities, bilaterally. -No evidence of popliteal cyst, bilaterally.   *See table(s) above for measurements and observations.    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  Catheter ablation is indicated.  As she has a relationship with Dr. Clydie Braunavid [facility], I would recommend that she follow-up with him once she is discharged from the hospital.  If she has recurrent SVT, then up titration in the short-term of her beta-blocker is most appropriate.  Intravenous adenosine could be used for short-term relief of incessant SVT. Stroke -I strongly suspect that the etiology of the patient's stroke is secondary to atrial fibrillation as a consequence of SVT although we have no documentation of this.  Implantable loop recorder would be a strong consideration prior to discharge.   Risk Assessment/Risk Scores:         For questions or updates, please contact [facility] Please consult [email or web address] for contact info under    Signed, Lewayne BuntingGregg Michaeljohn Biss, MD  05/07/2021 2:58 PM

## 2021-05-07 NOTE — Evaluation (Signed)
Physical Therapy Evaluation Patient Details Name: Sandra Nielsen MRN: [id number] DOB: 1951-03-08 Today's Date: 05/07/2021   History of Present Illness  70 yo female admitted with slurred speech L mouth droop and drooling. MRI acute R insula infarct  PMH: SVT, breast CA s/p R mastectomy, chronic bronchitis, RA, HLD, tobacco abuse, COVID, pelvis fx (03/11/2021),  wears glasses  Clinical Impression  Pt admitted with above diagnosis. On eval, pt able to ambulate with RW 100' with min-guard A with antalgic pattern since pelvic fx. Pt relays back pain and L hip pain since returning to full WB'ing. She has been receiving HHPT and recommend continuing this upon return home. After pt seated in chair, she had another episode of VT with HR up to 220 bpm, RN notified. Pt not in distress. Will plan to continue to follow pt to mobilize with close monitoring of VS.   Pt currently with functional limitations due to the deficits listed below (see PT Problem List). Pt will benefit from skilled PT to increase their independence and safety with mobility to allow discharge to the venue listed below.       Follow Up Recommendations Home health PT (continue)    Equipment Recommendations  None recommended by PT    Recommendations for Other Services       Precautions / Restrictions Precautions Precautions: Fall Restrictions Weight Bearing Restrictions: No      Mobility  Bed Mobility Overal bed mobility: Needs Assistance Bed Mobility: Supine to Sit     Supine to sit: Supervision     General bed mobility comments: Pt sitting in long sit position on arrival with HOB flat. Supervision to pivot towards EOB    Transfers Overall transfer level: Needs assistance Equipment used: Rolling walker (2 wheeled) Transfers: Sit to/from Stand Sit to Stand: Min guard         General transfer comment: Min Guard A for safety  Ambulation/Gait Ambulation/Gait assistance: Land (Feet): 100  Feet Assistive device: Rolling walker (2 wheeled) Gait Pattern/deviations: Antalgic Gait velocity: decreased Gait velocity interpretation: 1.31 - 2.62 ft/sec, indicative of limited community ambulator General Gait Details: pt with antalgic pattern with L ankle inversion and R hip hike. HR remained 98-110 bpm with ambulation  Stairs            Wheelchair Mobility    Modified Rankin (Stroke Patients Only) Modified Rankin (Stroke Patients Only) Pre-Morbid Rankin Score: No symptoms Modified Rankin: Moderately severe disability     Balance Overall balance assessment: Mild deficits observed, not formally tested                                           Pertinent Vitals/Pain Pain Assessment: Faces Faces Pain Scale: Hurts little more Pain Location: low back Pain Descriptors / Indicators: Discomfort Pain Intervention(s): Limited activity within patient's tolerance;Monitored during session    Home Living Family/patient expects to be discharged to:: Private residence Living Arrangements: Alone Available Help at Discharge: Family;Available 24 hours/day Type of Home: House Home Access: Stairs to enter Entrance Stairs-Rails: None Entrance Stairs-Number of Steps: 2 Home Layout: One level Home Equipment: Tub bench;Bedside commode;Walker - 2 wheels Additional Comments: pt reports that daughter comes over every day and sometimes stays at night and can stay 24/7 if needed    Prior Function Level of Independence: Needs assistance   Gait / Transfers Assistance Needed: has used RW since  pelvic fx  ADL's / Homemaking Assistance Needed: Daughter provides supervision when pt transfers into and out of shower  Comments: daughter stays with her often and visits every day     Hand Dominance   Dominant Hand: Right    Extremity/Trunk Assessment   Upper Extremity Assessment Upper Extremity Assessment: Defer to OT evaluation RUE Deficits / Details: Pt with decreased  precision with finger opposition LUE Deficits / Details: LUE with decreased grip strength as compared to RUE. Decreased precision with LUE during fiunger opposition LUE Coordination: decreased fine motor    Lower Extremity Assessment Lower Extremity Assessment: Overall WFL for tasks assessed;RLE deficits/detail;LLE deficits/detail RLE Deficits / Details: had pelvic R pelvic fx 03/11/21 and was NWB several weeks, noted R hip higher than L RLE Sensation: WNL RLE Coordination: WNL LLE Deficits / Details: Pt relays L hip pain when she was able to begin FWB bilaterally, this has continued. She has L ankle inversion from old fx and surgery. LLE Sensation: WNL LLE Coordination: WNL    Cervical / Trunk Assessment Cervical / Trunk Assessment: Kyphotic (shoulders rounded forward)  Communication   Communication: No difficulties  Cognition Arousal/Alertness: Awake/alert Behavior During Therapy: WFL for tasks assessed/performed Overall Cognitive Status: Within Functional Limits for tasks assessed                                 General Comments: Pt pleasant and conversational throughout session      General Comments General comments (skin integrity, edema, etc.): HR as high as 117 bpm with grooming. After mobility pt returned to recliner and after a few minutes, HR spiked at 220 bpm, RN notified. BP 151/126    Exercises     Assessment/Plan    PT Assessment Patient needs continued PT services  PT Problem List Cardiopulmonary status limiting activity;Pain;Decreased activity tolerance;Decreased balance;Decreased mobility       PT Treatment Interventions DME instruction;Gait training;Stair training;Functional mobility training;Therapeutic activities;Therapeutic exercise;Balance training;Patient/family education    PT Goals (Current goals can be found in the Care Plan section)  Acute Rehab PT Goals Patient Stated Goal: get HR back under control PT Goal Formulation: With  patient Time For Goal Achievement: 05/21/21 Potential to Achieve Goals: Good    Frequency Min 4X/week   Barriers to discharge        Co-evaluation PT/OT/SLP Co-Evaluation/Treatment: Yes Reason for Co-Treatment: Complexity of the patient's impairments (multi-system involvement) PT goals addressed during session: Mobility/safety with mobility;Balance;Proper use of DME;Strengthening/ROM OT goals addressed during session: ADL's and self-care;Proper use of Adaptive equipment and DME       AM-PAC PT "6 Clicks" Mobility  Outcome Measure Help needed turning from your back to your side while in a flat bed without using bedrails?: None Help needed moving from lying on your back to sitting on the side of a flat bed without using bedrails?: None Help needed moving to and from a bed to a chair (including a wheelchair)?: A Little Help needed standing up from a chair using your arms (e.g., wheelchair or bedside chair)?: A Little Help needed to walk in hospital room?: A Little Help needed climbing 3-5 steps with a railing? : A Little 6 Click Score: 20    End of Session Equipment Utilized During Treatment: Gait belt Activity Tolerance: Treatment limited secondary to medical complications (Comment) (SVT) Patient left: in chair;with call bell/phone within reach;with chair alarm set Nurse Communication: Mobility status PT Visit Diagnosis: Difficulty in  walking, not elsewhere classified (R26.2);Pain Pain - part of body:  (back)    Time: 7793-9030 PT Time Calculation (min) (ACUTE ONLY): 31 min   Charges:   PT Evaluation $PT Eval Moderate Complexity: 1 Mod          [place], PT  Acute Rehab Services  Pager [phone] Office [phone]   Lawana Chambers Kimmie Doren 05/07/2021, 1:24 PM

## 2021-05-07 NOTE — Significant Event (Signed)
Rapid Response Event Note   Reason for Call :  SVT, up to 200 bpm- episode lasting approximately 30 minutes BP 97/62  Initial Focused Assessment:  Pt asymptomatic. She informs me she was "finally getting some rest" when the bedside monitor alarm and staff entering room woke her. She self converted back to NSR, BP now 113/60. Skin is warm, dry.   VS: T 97.5, BP 113/60, HR 89, RR 13, SpO2 96% on room air  Interventions:  -Cardizem gtt increased from 10 mg/hr to 12.5 mg/hr  Plan of Care:  -Transition back to PO Cardizem today, per MD  Call rapid response for additional needs  Event Summary:  MD Notified: Dr. Sherryll Burger Call Time: 0755 Arrival Time: 0800 End Time: 2353  Jennye Moccasin, RN

## 2021-05-07 NOTE — Consult Note (Addendum)
The patient has been seen in conjunction with Micah FlesherAngela Duke, PAC. All aspects of care have been considered and discussed. The patient has been personally interviewed, examined, and all clinical data has been reviewed.  Known PSVT followed by Dr. Sampson GoonFitzgerald on metoprolol, Flecainide, and Diltiazem. Admitted with CVA and had partial withhold of arrhythmia therapy with recurrence of SVT at rates > 200 bpm and wide complex LBBB aberration at rate > 190 bpm. Had spontaneous resolution on iv dilt and after resolution, additional metoprolol given. Consulted Dr. Rosette RevealG Taylor of EP service who will provide arrhythmia guidance. Instructed to maintain antiarrhythmic regimen (Flecainide/Diltiazem/Metoprolol) and prevent low BP associated with uncontrolled tachycardia.  Cardiology Consultation:   Patient ID: Marguarite ArbourBarbara Ballard MRN: [id number]; DOB: [birth year]  Admit date: 05/05/2021 Date of Consult: 05/07/2021  PCP:  Shellia CleverlyBeane, Lori M, PA   [facility] Providers Cardiologist:  Dr. Katrinka BlazingSmith, Dr. Ladona Ridgelaylor (EP), Dr. Velora MediateFitzgerald Click here to update MD or APP on Care Team, Refresh:1}     Patient Profile:   Marguarite ArbourBarbara Passero is a 70 y.o. female with a hx of SVT/ PVCs, COPD, HLD, HTN, RA, and ongoing tobacco uise who is being seen 05/07/2021 for the evaluation of SVT at the request of Dr. Sherryll BurgerShah.  History of Present Illness:   Ms. Yancey FlemingsBullin [facility] (Dr. Sampson GoonFitzgerald). Nuclear stress test in 11/2018 was nonischemic. She denies history of MI or PCI/stents. She reports extensive workup for rapid heart rate and is now being treated for SVT with flecainide, cardizem, and metoprolol.   She was admitted 03/11/21 with syncope and collapse and SVT in the setting of COVID infection. Echo at that time with normal LVEF, mild PH, mild MR, mild TR. Syncope felt related to orthostatic hypotension and decreased PO intake. SNF was recommended but patient declined.  She states she feels her SVT and generally has bouts of SVT  lasting "seconds" once every 3-4 weeks. She does not currently take PRN or pill-in-pocket medication. She states that leading up to her stroke prompting the current admission, she had rapid heart beat 2-3 times weekly, a clear increase in symptoms. [facility] with facial droop and slurred speech. Code stroke initiated. CT heat negative for acute findings. Permissive hypertension for 48 hrs. She was started on ASA, plavix, and lipitor. Given permissive hypertension, home cardizem and metoprolol were held. She was continued on flecainide. As such, she has been in and out of SVT since yesterday. She initially converted with IV cardizem bolus + drip. However, she has continued to have bouts of HR 180-220s. Cardiology called to bedside.   Telemetry reviewed with sinus rhythm between episodes of SVT. Two 12-lead EKGs consistent with SVT at rates in the 180s. Rates in the 190s show rate related bundle in the setting of flecainide. Primary team ordered 5 mg IV lopressor with SBP in the 150s. Just prior to administration, BP rechecked and was in the 80s. Dr. Katrinka BlazingSmith and Dr. Ladona Ridgelaylor called to bedside to review options for rate/rhythm controlling medications in the setting of hypotension and flecainide.    Past Medical History:  Diagnosis Date   Female cystocele    History of breast cancer    S/P RIGHT MASTECTOMY / NO CHEMORADIATION/   NO RECURRENCE   History of chronic bronchitis    Smokers' cough (HCC)    Wears glasses     Past Surgical History:  Procedure Laterality Date   CYSTOCELE REPAIR N/A 09/27/2013   Procedure: BOSTON SCIENTIFIC UPHOLD LITE SACROSPINOUS LIGAMENT  REPAIR , Rectocele Repair with Xenform, Cystocele repair with Xenform, Vaginal Vault Suspension, Joelene Millin;  Surgeon: Kathi Ludwig, MD;  Location: [facility]  Service: Urology;  Laterality: N/A;   LAPAROTOMY W/ BILATERAL SALPINGOOPHORECTOMY  JAN 1996   LEFT ANKLE RECONSTRUCTION  10-06-1979    MASTECTOMY WITH AXILLARY LYMPH NODE DISSECTION Right 02-16-1993   VAGINAL HYSTERECTOMY  1981     Home Medications:  Prior to Admission medications   Medication Sig Start Date End Date Taking? Authorizing Provider  atorvastatin (LIPITOR) 20 MG tablet Take 20 mg by mouth daily. 07/01/19  Yes [provider]  diltiazem (CARDIZEM CD) 120 MG 24 hr capsule Take 120 mg by mouth 2 (two) times daily. 12/10/19  Yes [provider]  flecainide (TAMBOCOR) 50 MG tablet Take 50 mg by mouth 2 (two) times daily. 11/16/19  Yes [provider]  leflunomide (ARAVA) 10 MG tablet Take 20 mg by mouth daily. 11/25/19  Yes [provider]  metoprolol succinate (TOPROL-XL) 25 MG 24 hr tablet Take 25 mg by mouth daily. 11/25/19  Yes [provider]  omeprazole (PRILOSEC) 40 MG capsule Take 40 mg by mouth at bedtime. 07/01/19  Yes [provider]  pantoprazole (PROTONIX) 40 MG tablet Take 40 mg by mouth daily. 12/17/19  Yes [provider]  Polyethylene Glycol 3350 (MIRALAX PO) Take 1 Bottle by mouth daily as needed (constipation).   Yes [provider]  predniSONE (DELTASONE) 50 MG tablet Take 1 pill daily for 5 days. Patient taking differently: Take 50 mg by mouth daily with breakfast. 01/09/15  Yes Charm Rings, MD  TRELEGY ELLIPTA 100-62.5-25 MCG/INH AEPB Inhale 1 puff into the lungs daily. 12/17/19  Yes [provider]  HYDROcodone-acetaminophen (NORCO/VICODIN) 5-325 MG per tablet Take 1-2 tablets by mouth every 6 (six) hours as needed for moderate pain or severe pain. Patient not taking: No sig reported 11/13/14   Presson, Mathis Fare, PA  levofloxacin (LEVAQUIN) 500 MG tablet Take 1 tablet (500 mg total) by mouth daily. Patient not taking: No sig reported 01/09/15   Charm Rings, MD  mupirocin cream (BACTROBAN) 2 % Apply 1 application topically 2 (two) times daily. Patient not taking: No sig reported 12/24/19   Vivi Barrack, DPM   oxyCODONE-acetaminophen (ROXICET) 5-325 MG per tablet Take 1 tablet by mouth every 4 (four) hours as needed for severe pain. Patient not taking: No sig reported 09/27/13   Jethro Bolus, MD  valACYclovir (VALTREX) 1000 MG tablet Take 1 tablet (1,000 mg total) by mouth 3 (three) times daily. X 7 days Patient not taking: No sig reported 11/13/14   Presson, Mathis Fare, PA    Inpatient Medications: Scheduled Meds:  aspirin EC  81 mg Oral Daily   atorvastatin  20 mg Oral Daily   clopidogrel  75 mg Oral Daily   clotrimazole   Topical BID   diltiazem  120 mg Oral BID   enoxaparin (LOVENOX) injection  40 mg Subcutaneous Q24H   flecainide  50 mg Oral BID   fluticasone furoate-vilanterol  1 puff Inhalation Daily   And   umeclidinium bromide  1 puff Inhalation Daily   leflunomide  20 mg Oral Daily   lidocaine  1 patch Transdermal Q24H   metoprolol succinate  25 mg Oral Daily   nystatin   Topical BID   pantoprazole  40 mg Oral Daily   predniSONE  5 mg Oral Q breakfast   Continuous Infusions:  PRN  Meds: acetaminophen **OR** acetaminophen (TYLENOL) oral liquid 160 mg/5 mL **OR** acetaminophen, levalbuterol, metoprolol tartrate  Allergies:    Allergies  Allergen Reactions   Meloxicam Swelling    Other reaction(s): Swelling    Penicillins Hives and Swelling   Levofloxacin Palpitations    Social History:   Social History   Socioeconomic History   Marital status: Widowed    Spouse name: Not on file   Number of children: 3   Years of education: Not on file   Highest education level: Not on file  Occupational History   Occupation: Retired  Tobacco Use   Smoking status: Every Day    Packs/day: 1.00    Years: 40.00    Pack years: 40.00    Types: Cigarettes   Smokeless tobacco: Never  Vaping Use   Vaping Use: Never used  Substance and Sexual Activity   Alcohol use: No   Drug use: No   Sexual activity: Not on file  Other Topics Concern   Not on file  Social  History Narrative   Not on file   Social Determinants of Health   Financial Resource Strain: Not on file  Food Insecurity: Not on file  Transportation Needs: Not on file  Physical Activity: Not on file  Stress: Not on file  Social Connections: Not on file  Intimate Partner Violence: Not on file    Family History:   History reviewed. No pertinent family history.   ROS:  Please see the history of present illness.   All other ROS reviewed and negative.     Physical Exam/Data:   Vitals:   05/07/21 0758 05/07/21 0802 05/07/21 0806 05/07/21 0902  BP:  113/60    Pulse:  89 90   Resp: Temp:      TempSrc:      SpO2:  96% 94% 97%  Weight:      Height:       No intake or output data in the 24 hours ending 05/07/21 1121 Last 3 Weights 05/05/2021 09/27/2013 09/23/2013  Weight (lbs) 157 lb 13.6 oz 172 lb 8 oz 164 lb  Weight (kg) 71.6 kg 78.245 kg 74.39 kg     Body mass index is 23.31 kg/m.  General:  mildly obese female in no acute distress despite HR in the 180s Neck: no JVD Vascular: No carotid bruits;  Cardiac:  regular rhythm, tachycardic rate, no murmur Lungs:  respirations unlabored Abd: soft, nontender, no hepatomegaly  Ext: no edema Musculoskeletal:  No deformities, BUE and BLE strength normal and equal Skin: warm and dry  Neuro:  CNs 2-12 intact, no focal abnormalities noted Psych:  Normal affect   EKG:  The EKG was personally reviewed and demonstrates:  SVT HR 180, SVT 193 with LBBB Telemetry:  Telemetry was personally reviewed and demonstrates:  bouts of sinus rhythm in the 60-70s and SVT in the 180-210s  Relevant CV Studies:  Echo 02/2021: Previous study not aavailable.  The left ventricular size is normal.  Mild left ventricular hypertrophy  LV ejection fraction = 55-60%.  There is mild mitral regurgitation.  There is mild tricuspid regurgitation.  Mild pulmonary hypertension.  Estimated right ventricular systolic pressure is 31  mmHg  Laboratory Data:  High Sensitivity Troponin:  No results for input(s): TROPONINIHS in the last 720 hours.   Chemistry Recent Labs  Lab 05/05/21 1211 05/05/21 1219  NA 135 135  K 3.6 3.9  CL 98 98  CO2 26  --  GLUCOSE 172* 168*  BUN 10 11  CREATININE 0.90 0.70  CALCIUM 9.7  --   GFRNONAA >60  --   ANIONGAP 11  --     Recent Labs  Lab 05/05/21 1211  PROT 6.5  ALBUMIN 3.3*  AST 23  ALT 19  ALKPHOS 132*  BILITOT 0.7   Hematology Recent Labs  Lab 05/05/21 1211 05/05/21 1219  WBC 9.9  --   RBC 3.49*  --   HGB 12.6 13.3  HCT 38.0 39.0  MCV 108.9*  --   MCH 36.1*  --   MCHC 33.2  --   RDW 16.0*  --   PLT 293  --    BNPNo results for input(s): BNP, PROBNP in the last 168 hours.  DDimer No results for input(s): DDIMER in the last 168 hours.   Radiology/Studies:  DG Cervical Spine 2 or 3 views  Result Date: 05/05/2021 CLINICAL DATA:  Neck pain on the right side. EXAM: CERVICAL SPINE - 2-3 VIEW COMPARISON:  None. FINDINGS: There is no evidence of cervical spine fracture or prevertebral soft tissue swelling. 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 There are moderate to severe degenerative changes from C3-4 to C6-7. There is an anterior wedge deformity of the C4 vertebral body with less than 25% height loss which appears chronic. 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 No acute osseous injury. Electronically Signed   By: Romona Curls M.D.   On: 05/05/2021 16:19   MR ANGIO HEAD WO CONTRAST  Result Date: 05/05/2021 CLINICAL DATA:  Neuro deficit, acute, stroke suspected. Slurred speech. EXAM: MRI HEAD WITHOUT CONTRAST MRA HEAD WITHOUT CONTRAST MRA OF THE NECK WITHOUT AND WITH CONTRAST TECHNIQUE: Multiplanar, multi-echo pulse sequences of the brain and surrounding structures were acquired without intravenous contrast. Angiographic images of the Circle of Willis were acquired using MRA technique without intravenous contrast.  Angiographic images of the neck were acquired using MRA technique without and with intravenous contrast. Carotid stenosis measurements (when applicable) are obtained utilizing NASCET criteria, using the distal internal carotid diameter as the denominator. CONTRAST:  7.44mL GADAVIST GADOBUTROL 1 MMOL/ML IV SOLN COMPARISON:  Head CT 05/05/2020 FINDINGS: MR HEAD FINDINGS Brain: There is a small acute infarct involving the right insula. Patchy T2 hyperintensities in the cerebral white matter and pons are nonspecific but compatible with moderate chronic small vessel ischemic disease. Mild cerebral atrophy is within normal limits for age. No intracranial hemorrhage, mass, midline shift, or extra-axial fluid collection is identified. Vascular: Major intracranial vascular flow voids are preserved. Skull and upper cervical spine: Unremarkable bone marrow signal. Sinuses/Orbits: Unremarkable orbits. Moderate mucosal thickening in the left maxillary sinus. Small bilateral mastoid effusions. Other: None. MRA HEAD FINDINGS Anterior circulation: The internal carotid arteries are widely patent from skull base to carotid termini. The ACAs and MCAs are patent without evidence of a proximal branch occlusion or significant proximal stenosis. No aneurysm is identified. Posterior circulation: The included intracranial portions of the vertebral arteries are patent to the basilar with the left being strongly dominant. The basilar artery is widely patent. There are moderate-sized posterior communicating arteries bilaterally. Both PCAs are patent without evidence of a significant proximal stenosis. No aneurysm is identified. Anatomic variants: Duplicated middle cerebral arteries bilaterally. Hypoplastic right vertebral artery. MRA NECK FINDINGS The study is mildly motion degraded. Aortic arch: Normal variant 4 vessel aortic arch with the left vertebral artery arising from the arch. Widely patent arch vessel origins. Right carotid system:  Patent  without evidence of a significant stenosis or dissection. Left carotid system: Patent without evidence of a significant stenosis or dissection. Vertebral arteries: The vertebral arteries are patent with antegrade flow bilaterally and with the left being dominant. Motion artifact limits assessment of the vertebral artery origins, however there is no evidence of a significant stenosis or dissection on either side more distally. IMPRESSION: 1. 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 2. Moderate chronic small vessel ischemic disease. 3. Negative head MRA. 4. Negative neck MRA within limitations of mild motion artifact. Electronically Signed   By: Sebastian Ache M.D.   On: 05/05/2021 15:11   MR ANGIO NECK W WO CONTRAST  Result Date: 05/05/2021 CLINICAL DATA:  Neuro deficit, acute, stroke suspected. Slurred speech. EXAM: MRI HEAD WITHOUT CONTRAST MRA HEAD WITHOUT CONTRAST MRA OF THE NECK WITHOUT AND WITH CONTRAST TECHNIQUE: Multiplanar, multi-echo pulse sequences of the brain and surrounding structures were acquired without intravenous contrast. Angiographic images of the Circle of Willis were acquired using MRA technique without intravenous contrast. Angiographic images of the neck were acquired using MRA technique without and with intravenous contrast. Carotid stenosis measurements (when applicable) are obtained utilizing NASCET criteria, using the distal internal carotid diameter as the denominator. CONTRAST:  7.27mL GADAVIST GADOBUTROL 1 MMOL/ML IV SOLN COMPARISON:  Head CT 05/05/2020 FINDINGS: MR HEAD FINDINGS Brain: There is a small acute infarct involving the right insula. Patchy T2 hyperintensities in the cerebral white matter and pons are nonspecific but compatible with moderate chronic small vessel ischemic disease. Mild cerebral atrophy is within normal limits for age. No intracranial hemorrhage, mass, midline shift, or extra-axial fluid collection is identified. Vascular: Major intracranial  vascular flow voids are preserved. Skull and upper cervical spine: Unremarkable bone marrow signal. Sinuses/Orbits: Unremarkable orbits. Moderate mucosal thickening in the left maxillary sinus. Small bilateral mastoid effusions. Other: None. MRA HEAD FINDINGS Anterior circulation: The internal carotid arteries are widely patent from skull base to carotid termini. The ACAs and MCAs are patent without evidence of a proximal branch occlusion or significant proximal stenosis. No aneurysm is identified. Posterior circulation: The included intracranial portions of the vertebral arteries are patent to the basilar with the left being strongly dominant. The basilar artery is widely patent. There are moderate-sized posterior communicating arteries bilaterally. Both PCAs are patent without evidence of a significant proximal stenosis. No aneurysm is identified. Anatomic variants: Duplicated middle cerebral arteries bilaterally. Hypoplastic right vertebral artery. MRA NECK FINDINGS The study is mildly motion degraded. Aortic arch: Normal variant 4 vessel aortic arch with the left vertebral artery arising from the arch. Widely patent arch vessel origins. Right carotid system: Patent without evidence of a significant stenosis or dissection. Left carotid system: Patent without evidence of a significant stenosis or dissection. Vertebral arteries: The vertebral arteries are patent with antegrade flow bilaterally and with the left being dominant. Motion artifact limits assessment of the vertebral artery origins, however there is no evidence of a significant stenosis or dissection on either side more distally. IMPRESSION: 1. 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 2. Moderate chronic small vessel ischemic disease. 3. Negative head MRA. 4. Negative neck MRA within limitations of mild motion artifact. Electronically Signed   By: Sebastian Ache M.D.   On: 05/05/2021 15:11   MR BRAIN WO CONTRAST  Result Date: 05/05/2021 CLINICAL  DATA:  Neuro deficit, acute, stroke suspected. Slurred speech. EXAM: MRI HEAD WITHOUT CONTRAST MRA HEAD WITHOUT CONTRAST MRA OF THE NECK WITHOUT AND WITH CONTRAST TECHNIQUE: Multiplanar, multi-echo pulse sequences of  the brain and surrounding structures were acquired without intravenous contrast. Angiographic images of the Circle of Willis were acquired using MRA technique without intravenous contrast. Angiographic images of the neck were acquired using MRA technique without and with intravenous contrast. Carotid stenosis measurements (when applicable) are obtained utilizing NASCET criteria, using the distal internal carotid diameter as the denominator. CONTRAST:  7.61mL GADAVIST GADOBUTROL 1 MMOL/ML IV SOLN COMPARISON:  Head CT 05/05/2020 FINDINGS: MR HEAD FINDINGS Brain: There is a small acute infarct involving the right insula. Patchy T2 hyperintensities in the cerebral white matter and pons are nonspecific but compatible with moderate chronic small vessel ischemic disease. Mild cerebral atrophy is within normal limits for age. No intracranial hemorrhage, mass, midline shift, or extra-axial fluid collection is identified. Vascular: Major intracranial vascular flow voids are preserved. Skull and upper cervical spine: Unremarkable bone marrow signal. Sinuses/Orbits: Unremarkable orbits. Moderate mucosal thickening in the left maxillary sinus. Small bilateral mastoid effusions. Other: None. MRA HEAD FINDINGS Anterior circulation: The internal carotid arteries are widely patent from skull base to carotid termini. The ACAs and MCAs are patent without evidence of a proximal branch occlusion or significant proximal stenosis. No aneurysm is identified. Posterior circulation: The included intracranial portions of the vertebral arteries are patent to the basilar with the left being strongly dominant. The basilar artery is widely patent. There are moderate-sized posterior communicating arteries bilaterally. Both PCAs are  patent without evidence of a significant proximal stenosis. No aneurysm is identified. Anatomic variants: Duplicated middle cerebral arteries bilaterally. Hypoplastic right vertebral artery. MRA NECK FINDINGS The study is mildly motion degraded. Aortic arch: Normal variant 4 vessel aortic arch with the left vertebral artery arising from the arch. Widely patent arch vessel origins. Right carotid system: Patent without evidence of a significant stenosis or dissection. Left carotid system: Patent without evidence of a significant stenosis or dissection. Vertebral arteries: The vertebral arteries are patent with antegrade flow bilaterally and with the left being dominant. Motion artifact limits assessment of the vertebral artery origins, however there is no evidence of a significant stenosis or dissection on either side more distally. IMPRESSION: 1. 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 2. Moderate chronic small vessel ischemic disease. 3. Negative head MRA. 4. Negative neck MRA within limitations of mild motion artifact. Electronically Signed   By: Sebastian Ache M.D.   On: 05/05/2021 15:11   ECHOCARDIOGRAM COMPLETE BUBBLE STUDY  Result Date: 05/06/2021    ECHOCARDIOGRAM REPORT   Patient Name:   LESHEA JAGGERS Date of Exam: 05/06/2021 Medical Rec #:  [id number]      Height:       69.0 in Accession #:    [id number]     Weight:       157.8 lb Date of Birth:  1950-11-13      BSA:          1.868 m Patient Age:    69 years       BP:           133/62 mmHg Patient Gender: F              HR:           81 bpm. Exam Location:  Inpatient Procedure: 2D Echo, Cardiac Doppler, Color Doppler and Saline Contrast Bubble            Study Indications:    Stroke 434.91/I63.9  History:        Patient has no prior history of Echocardiogram examinations.  Signs/Symptoms:Dizziness/Lightheadedness.  Sonographer:    Roosvelt Maser RDCS Referring Phys: [phone] Malachi Carl STACK IMPRESSIONS  1. Left ventricular ejection  fraction, by estimation, is 60 to 65%. The left ventricle has normal function. The left ventricle has no regional wall motion abnormalities. Left ventricular diastolic parameters are consistent with Grade I diastolic dysfunction (impaired relaxation).  2. Right ventricular systolic function is normal. The right ventricular size is normal. There is normal pulmonary artery systolic pressure.  3. The mitral valve is normal in structure. Mild mitral valve regurgitation. No evidence of mitral stenosis.  4. The aortic valve is tricuspid. Aortic valve regurgitation is not visualized.  5. The inferior vena cava is normal in size with greater than 50% respiratory variability, suggesting right atrial pressure of 3 mmHg. FINDINGS  Left Ventricle: Left ventricular ejection fraction, by estimation, is 60 to 65%. The left ventricle has normal function. The left ventricle has no regional wall motion abnormalities. The left ventricular internal cavity size was normal in size. There is  no left ventricular hypertrophy. Left ventricular diastolic parameters are consistent with Grade I diastolic dysfunction (impaired relaxation). Right Ventricle: The right ventricular size is normal. No increase in right ventricular wall thickness. Right ventricular systolic function is normal. There is normal pulmonary artery systolic pressure. Left Atrium: Left atrial size was normal in size. Right Atrium: Right atrial size was normal in size. Pericardium: There is no evidence of pericardial effusion. Mitral Valve: The mitral valve is normal in structure. Mild mitral valve regurgitation. No evidence of mitral valve stenosis. Tricuspid Valve: The tricuspid valve is normal in structure. Tricuspid valve regurgitation is mild . No evidence of tricuspid stenosis. Aortic Valve: The aortic valve is tricuspid. Aortic valve regurgitation is not visualized. Aortic valve mean gradient measures 4.0 mmHg. Aortic valve peak gradient measures 8.0 mmHg. Aortic  valve area, by VTI measures 2.25 cm. Pulmonic Valve: The pulmonic valve was not well visualized. Pulmonic valve regurgitation is not visualized. No evidence of pulmonic stenosis. Aorta: The aortic root is normal in size and structure. Venous: The inferior vena cava is normal in size with greater than 50% respiratory variability, suggesting right atrial pressure of 3 mmHg. IAS/Shunts: No atrial level shunt detected by color flow Doppler. Agitated saline contrast was given intravenously to evaluate for intracardiac shunting.  LEFT VENTRICLE PLAX 2D LVIDd:         4.50 cm  Diastology LVIDs:         2.90 cm  LV e' medial:    7.40 cm/s LV PW:         1.00 cm  LV E/e' medial:  9.0 LV IVS:        1.00 cm  LV e' lateral:   8.38 cm/s LVOT diam:     1.90 cm  LV E/e' lateral: 8.0 LV SV:         62 LV SV Index:   33 LVOT Area:     2.84 cm                          3D Volume EF:                         3D EF:        60 %                         LV EDV:  121 ml                         LV ESV:       48 ml                         LV SV:        73 ml RIGHT VENTRICLE RV Basal diam:  3.20 cm LEFT ATRIUM           Index       RIGHT ATRIUM           Index LA diam:      3.60 cm 1.93 cm/m  RA Area:     12.70 cm LA Vol (A2C): 40.7 ml 21.78 ml/m RA Volume:   28.00 ml  14.99 ml/m  AORTIC VALVE AV Area (Vmax):    2.23 cm AV Area (Vmean):   2.18 cm AV Area (VTI):     2.25 cm AV Vmax:           141.00 cm/s AV Vmean:          97.700 cm/s AV VTI:            0.274 m AV Peak Grad:      8.0 mmHg AV Mean Grad:      4.0 mmHg LVOT Vmax:         111.00 cm/s LVOT Vmean:        75.200 cm/s LVOT VTI:          0.217 m LVOT/AV VTI ratio: 0.79  AORTA Ao Root diam: 3.10 cm Ao Asc diam:  3.20 cm MITRAL VALVE MV Area (PHT): 4.39 cm     SHUNTS MV Decel Time: 173 msec     Systemic VTI:  0.22 m MV E velocity: 66.80 cm/s   Systemic Diam: 1.90 cm MV A velocity: 139.00 cm/s MV E/A ratio:  0.48 Dina Rich MD Electronically signed by Dina Rich  MD Signature Date/Time: 05/06/2021/1:24:17 PM    Final    CT HEAD CODE STROKE WO CONTRAST  Result Date: 05/05/2021 CLINICAL DATA:  Code stroke. Neuro deficit, acute, stroke suspected. Left facial droop and slurred speech. EXAM: CT HEAD WITHOUT CONTRAST TECHNIQUE: Contiguous axial images were obtained from the base of the skull through the vertex without intravenous contrast. COMPARISON:  None. FINDINGS: Brain: There is no evidence of an acute infarct, intracranial hemorrhage, mass, midline shift, or extra-axial fluid collection. Hypodensities in the cerebral white matter bilaterally are nonspecific but compatible with mild chronic small vessel ischemic disease. Mild cerebral atrophy is within normal limits for age. Vascular: Calcified atherosclerosis at the skull base. No hyperdense vessel. Skull: No fracture or suspicious osseous lesion. Sinuses/Orbits: Moderate mucosal thickening in the left maxillary sinus. Clear mastoid air cells. Unremarkable orbits. Other: None. ASPECTS [facility] Stroke Program Early CT Score) - Ganglionic level infarction (caudate, lentiform nuclei, internal capsule, insula, M1-M3 cortex): 7 - Supraganglionic infarction (M4-M6 cortex): 3 Total score (0-10 with 10 being normal): 10 IMPRESSION: 1. No evidence of acute intracranial abnormality. 2. ASPECTS is 10. 3. Mild chronic small vessel ischemic disease. These results were communicated to Dr. Selina Cooley at 12:27 pm on 05/05/2021 by text page via the [facility] messaging system. Electronically Signed   By: Sebastian Ache M.D.   On: 05/05/2021 12:27   VAS [place] LOWER EXTREMITY VENOUS (DVT)  Result Date: 05/06/2021  Lower Venous DVT Study Patient Name:  SHOSHANA JOHAL  Date  of Exam:   05/06/2021 Medical Rec #: [id number]       Accession #:    [id number] Date of Birth: Sep 04, 1951       Patient Gender: F Patient Age:   69Y Exam Location:  [facility] Procedure:      VAS [place] LOWER EXTREMITY VENOUS (DVT) Referring Phys: [id number] Marvel Plan  --------------------------------------------------------------------------------  Indications: Stroke.  Comparison Study: No previous exams Performing Technologist: Jody Hill RVT, RDMS  Examination Guidelines: A complete evaluation includes B-mode imaging, spectral Doppler, color Doppler, and power Doppler as needed of all accessible portions of each vessel. Bilateral testing is considered an integral part of a complete examination. Limited examinations for reoccurring indications may be performed as noted. The reflux portion of the exam is performed with the patient in reverse Trendelenburg.  +---------+---------------+---------+-----------+----------+----------------+ RIGHT    CompressibilityPhasicitySpontaneityPropertiesThrombus Aging   +---------+---------------+---------+-----------+----------+----------------+ CFV      Full           Yes      Yes                                   +---------+---------------+---------+-----------+----------+----------------+ SFJ      Full                                                          +---------+---------------+---------+-----------+----------+----------------+ FV Prox  Full           Yes      Yes                                   +---------+---------------+---------+-----------+----------+----------------+ FV Mid   Full           Yes      Yes                                   +---------+---------------+---------+-----------+----------+----------------+ FV DistalFull           Yes      Yes                                   +---------+---------------+---------+-----------+----------+----------------+ PFV      Full                                                          +---------+---------------+---------+-----------+----------+----------------+ POP      Full           Yes      Yes                                   +---------+---------------+---------+-----------+----------+----------------+ PTV      Full                                                           +---------+---------------+---------+-----------+----------+----------------+  PERO                                                  Not seen on exam +---------+---------------+---------+-----------+----------+----------------+   Right Technical Findings: Not visualized segments include peroneal veins.  +---------+---------------+---------+-----------+----------+-------------------+ LEFT     CompressibilityPhasicitySpontaneityPropertiesThrombus Aging      +---------+---------------+---------+-----------+----------+-------------------+ CFV      Full           Yes      Yes                                      +---------+---------------+---------+-----------+----------+-------------------+ SFJ      Full                                                             +---------+---------------+---------+-----------+----------+-------------------+ FV Prox  Full           Yes      Yes                                      +---------+---------------+---------+-----------+----------+-------------------+ FV Mid   Full           Yes      Yes                                      +---------+---------------+---------+-----------+----------+-------------------+ FV DistalFull           Yes      Yes                                      +---------+---------------+---------+-----------+----------+-------------------+ PFV      Full                                                             +---------+---------------+---------+-----------+----------+-------------------+ POP      Full           Yes      Yes                                      +---------+---------------+---------+-----------+----------+-------------------+ PTV      Full                                                             +---------+---------------+---------+-----------+----------+-------------------+ PERO     Full  Not well visualized +---------+---------------+---------+-----------+----------+-------------------+     Summary: BILATERAL: - No evidence of deep vein thrombosis seen in the lower extremities, bilaterally. - No evidence of superficial venous thrombosis in the lower extremities, bilaterally. -No evidence of popliteal cyst, bilaterally.   *See table(s) above for measurements and observations.    Preliminary      Assessment and Plan:   SVT Hypotension CVA I do not see evidence of fib or flutter on telemetry, although this is a concern given her history of SVT and acute stroke Flecainide has been continued and PO metoprolol restarted. She initially converted to sinus on IV cardizem yesterday and this morning. Titration of IV cardizem limited by BP.  Will continue home flecainide and metoprolol. Case discussed with Dr. Ladona Ridgel (EP) who has recommended continuation of cardizem gtt as BP allows. IVF increase to 50 cc/hr for hypotension in the 80s while in SVT. In preparation of IV adenosine, she self-converted back to sinus rhythm in the 70s with improvement in her BP to the 100s. Cardizem gtt now running at 10 mg/hr. Will give 5 mg IV lopressor and additional 25 mg PO lopressor.   Suspect she will need to be monitor for fib/flutter and evaluated for ablation.   Appreciate further input from Dr. Ladona Ridgel.    Risk Assessment/Risk Scores:       For questions or updates, please contact [facility] Please consult [email or web address] for contact info under    Signed, Marcelino Duster, PA  05/07/2021 11:21 AM

## 2021-05-07 NOTE — Progress Notes (Signed)
Upon shift chang pt went into SVT in the 200's. MD and rapid were contacted. Tele monitor. Cardizem drip was titrated up and HR is under control.   VS: T 97.5, BP 113/60, HR 89, RR 13, SpO2 96% on room air   -Cardizem gtt increased from 10 mg/hr to 12.5 mg/hr   -Transition back to PO Cardizem today, per MD

## 2021-05-07 NOTE — Progress Notes (Signed)
Seen at bedside at request of hospitalist.  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  Recurrent paroxysms of regular SVT, favor AVNRT v. AT.  Known to consult service.  She has resumed normal sinus rhythm.  Resting comfortably on my evaluation lying in bed with BP 120/70, heart rate 75 in sinus, normal respirations.  Recommendations: - Continue flecainide 50 mg BID - Continue diltiazem 120 mg xr BID - Addition 25 mg metoprolol succinate now; if she tolerates increase to 50 mg daily - If recurrent, sustained, symptomatic SVT then can convert with IV adenosine per ACLS.  Luanna Salk, MD Cardiology Fellow [facility]

## 2021-05-08 DIAGNOSIS — I639 Cerebral infarction, unspecified: Secondary | ICD-10-CM | POA: Diagnosis not present

## 2021-05-08 DIAGNOSIS — Z720 Tobacco use: Secondary | ICD-10-CM

## 2021-05-08 DIAGNOSIS — E785 Hyperlipidemia, unspecified: Secondary | ICD-10-CM

## 2021-05-08 DIAGNOSIS — I63411 Cerebral infarction due to embolism of right middle cerebral artery: Secondary | ICD-10-CM | POA: Diagnosis not present

## 2021-05-08 LAB — BASIC METABOLIC PANEL
Anion gap: 7 (ref 5–15)
BUN: 8 mg/dL (ref 8–23)
CO2: 25 mmol/L (ref 22–32)
Calcium: 9.3 mg/dL (ref 8.9–10.3)
Chloride: 101 mmol/L (ref 98–111)
Creatinine, Ser: 0.77 mg/dL (ref 0.44–1.00)
GFR, Estimated: >60 mL/min (ref >60)
Glucose, Bld: 86 mg/dL (ref 70–99)
Potassium: 3.5 mmol/L (ref 3.5–5.1)
Sodium: 133 mmol/L — ABNORMAL LOW (ref 135–145)

## 2021-05-08 LAB — MAGNESIUM: Magnesium: 1.7 mg/dL (ref 1.7–2.4)

## 2021-05-08 MED ORDER — POTASSIUM CHLORIDE CRYS ER 20 MEQ PO TBCR
40.0000 meq | EXTENDED_RELEASE_TABLET | Freq: Once | ORAL | Status: AC
  Administered 2021-05-08: 40 meq via ORAL
  Filled 2021-05-08: qty 2

## 2021-05-08 MED ORDER — METOPROLOL SUCCINATE ER 25 MG PO TB24
25.0000 mg | ORAL_TABLET | Freq: Two times a day (BID) | ORAL | Status: DC
  Administered 2021-05-08: 25 mg via ORAL
  Filled 2021-05-08 (×2): qty 1

## 2021-05-08 MED ORDER — MAGNESIUM SULFATE 2 GM/50ML IV SOLN
2.0000 g | Freq: Once | INTRAVENOUS | Status: AC
  Administered 2021-05-08: 2 g via INTRAVENOUS
  Filled 2021-05-08: qty 50

## 2021-05-08 MED ORDER — ADENOSINE 6 MG/2ML IV SOLN
6.0000 mg | Freq: Once | INTRAVENOUS | Status: AC
  Administered 2021-05-08: 6 mg via INTRAVENOUS
  Filled 2021-05-08: qty 2

## 2021-05-08 MED ORDER — SODIUM CHLORIDE 0.9 % IV SOLN: INTRAVENOUS | Status: DC

## 2021-05-08 NOTE — Progress Notes (Signed)
STROKE TEAM PROGRESS NOTE   INTERVAL HISTORY No family at the bedside. Pt sitting in chair, still has episodes of SVT this am, HR up to 180s. Pt also had SVT episodes last night. Cardiology on board, on po cardizem, metoprolol and flecainide now. Plan to have ablation and loop recorder with Dr. Sampson Goon in [place] per cardiology  OBJECTIVE Vitals:   05/08/21 0333 05/08/21 0816 05/08/21 0854 05/08/21 1007  BP: 115/60 110/84  112/71  Pulse: 72 71  (!) 185  Resp: 18 17    Temp: 97.8 F (36.6 C) 98 F (36.7 C)    TempSrc: Oral Oral    SpO2: 100% 99% 99%   Weight:      Height:        CBC:  Recent Labs  Lab 05/05/21 1211 05/05/21 1219  WBC 9.9  --   NEUTROABS 8.1*  --   HGB 12.6 13.3  HCT 38.0 39.0  MCV 108.9*  --   PLT 293  --     Basic Metabolic Panel:  Recent Labs  Lab 05/07/21 1233 05/07/21 2239 05/08/21 0056  NA 135  --  133*  K 3.9 4.2 3.5  CL 100  --  101  CO2 25  --  25  GLUCOSE 103*  --  86  BUN 7*  --  8  CREATININE 0.83  --  0.77  CALCIUM 9.4  --  9.3  MG  --  1.8 1.7    Lipid Panel: No results found for: CHOL, TRIG, HDL, CHOLHDL, VLDL, LDLCALC HgbA1c:  Lab Results  Component Value Date   HGBA1C 5.4 05/05/2021   Urine Drug Screen:     Component Value Date/Time   LABOPIA NONE DETECTED 05/06/2021 0851   COCAINSCRNUR NONE DETECTED 05/06/2021 0851   LABBENZ NONE DETECTED 05/06/2021 0851   AMPHETMU NONE DETECTED 05/06/2021 0851   THCU NONE DETECTED 05/06/2021 0851   LABBARB NONE DETECTED 05/06/2021 0851    Alcohol Level No results found for: [facility]  IMAGING  DG Cervical Spine 2 or 3 views  Result Date: 05/05/2021 CLINICAL DATA:  Neck pain on the right side. EXAM: CERVICAL SPINE - 2-3 VIEW COMPARISON:  None. FINDINGS: There is no evidence of cervical spine fracture or prevertebral soft tissue swelling. 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 There are moderate to severe degenerative changes from C3-4 to C6-7. There is an anterior wedge  deformity of the C4 vertebral body with less than 25% height loss which appears chronic. 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 No acute osseous injury. Electronically Signed   By: Romona Curls M.D.   On: 05/05/2021 16:19   MR ANGIO HEAD WO CONTRAST  Result Date: 05/05/2021 CLINICAL DATA:  Neuro deficit, acute, stroke suspected. Slurred speech. EXAM: MRI HEAD WITHOUT CONTRAST MRA HEAD WITHOUT CONTRAST MRA OF THE NECK WITHOUT AND WITH CONTRAST TECHNIQUE: Multiplanar, multi-echo pulse sequences of the brain and surrounding structures were acquired without intravenous contrast. Angiographic images of the Circle of Willis were acquired using MRA technique without intravenous contrast. Angiographic images of the neck were acquired using MRA technique without and with intravenous contrast. Carotid stenosis measurements (when applicable) are obtained utilizing NASCET criteria, using the distal internal carotid diameter as the denominator. CONTRAST:  7.66mL GADAVIST GADOBUTROL 1 MMOL/ML IV SOLN COMPARISON:  Head CT 05/05/2020 FINDINGS: MR HEAD FINDINGS Brain: There is a small acute infarct involving the right insula. Patchy T2 hyperintensities in the cerebral white matter and pons are  nonspecific but compatible with moderate chronic small vessel ischemic disease. Mild cerebral atrophy is within normal limits for age. No intracranial hemorrhage, mass, midline shift, or extra-axial fluid collection is identified. Vascular: Major intracranial vascular flow voids are preserved. Skull and upper cervical spine: Unremarkable bone marrow signal. Sinuses/Orbits: Unremarkable orbits. Moderate mucosal thickening in the left maxillary sinus. Small bilateral mastoid effusions. Other: None. MRA HEAD FINDINGS Anterior circulation: The internal carotid arteries are widely patent from skull base to carotid termini. The ACAs and MCAs are patent without evidence of a proximal branch occlusion or  significant proximal stenosis. No aneurysm is identified. Posterior circulation: The included intracranial portions of the vertebral arteries are patent to the basilar with the left being strongly dominant. The basilar artery is widely patent. There are moderate-sized posterior communicating arteries bilaterally. Both PCAs are patent without evidence of a significant proximal stenosis. No aneurysm is identified. Anatomic variants: Duplicated middle cerebral arteries bilaterally. Hypoplastic right vertebral artery. MRA NECK FINDINGS The study is mildly motion degraded. Aortic arch: Normal variant 4 vessel aortic arch with the left vertebral artery arising from the arch. Widely patent arch vessel origins. Right carotid system: Patent without evidence of a significant stenosis or dissection. Left carotid system: Patent without evidence of a significant stenosis or dissection. Vertebral arteries: The vertebral arteries are patent with antegrade flow bilaterally and with the left being dominant. Motion artifact limits assessment of the vertebral artery origins, however there is no evidence of a significant stenosis or dissection on either side more distally. IMPRESSION: 1. ectronically Signed   By: Sebastian Ache M.D.   On: 05/05/2021 15:11   MR ANGIO NECK W WO CONTRAST  Result Date: 05/05/2021 CLINICAL DATA:  Neuro deficit, acute, stroke suspected. Slurred speech. EXAM: MRI HEAD WITHOUT CONTRAST MRA HEAD WITHOUT CONTRAST MRA OF THE NECK WITHOUT AND WITH CONTRAST TECHNIQUE: Multiplanar, multi-echo pulse sequences of the brain and surrounding structures were acquired without intravenous contrast. Angiographic images of the Circle of Willis were acquired using MRA technique without intravenous contrast. Angiographic images of the neck were acquired using MRA technique without and with intravenous contrast. Carotid stenosis measurements (when applicable) are obtained utilizing NASCET criteria, using the distal internal  carotid diameter as the denominator. CONTRAST:  7.67mL GADAVIST GADOBUTROL 1 MMOL/ML IV SOLN COMPARISON:  Head CT 05/05/2020 FINDINGS: MR HEAD FINDINGS Brain: There is a small acute infarct involving the right insula. Patchy T2 hyperintensities in the cerebral white matter and pons are nonspecific but compatible with moderate chronic small vessel ischemic disease. Mild cerebral atrophy is within normal limits for age. No intracranial hemorrhage, mass, midline shift, or extra-axial fluid collection is identified. Vascular: Major intracranial vascular flow voids are preserved. Skull and upper cervical spine: Unremarkable bone marrow signal. Sinuses/Orbits: Unremarkable orbits. Moderate mucosal thickening in the left maxillary sinus. Small bilateral mastoid effusions. Other: None. MRA HEAD FINDINGS Anterior circulation: The internal carotid arteries are widely patent from skull base to carotid termini. The ACAs and MCAs are patent without evidence of a proximal branch occlusion or significant proximal stenosis. No aneurysm is identified. Posterior circulation: The included intracranial portions of the vertebral arteries are patent to the basilar with the left being strongly dominant. The basilar artery is widely patent. There are moderate-sized posterior communicating arteries bilaterally. Both PCAs are patent without evidence of a significant proximal stenosis. No aneurysm is identified. Anatomic variants: Duplicated middle cerebral arteries bilaterally. Hypoplastic right vertebral artery. MRA NECK FINDINGS The study is mildly motion degraded. Aortic arch: Normal variant  4 vessel aortic arch with the left vertebral artery arising from the arch. Widely patent arch vessel origins. Right carotid system: Patent without evidence of a significant stenosis or dissection. Left carotid system: Patent without evidence of a significant stenosis or dissection. Vertebral arteries: The vertebral arteries are patent with antegrade  flow bilaterally and with the left being dominant. Motion artifact limits assessment of the vertebral artery origins, however there is no evidence of a significant stenosis or dissection on either side more distally. IMPRESSION: 1. 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 2. Moderate chronic small vessel ischemic disease. 3. Negative head MRA. 4. Negative neck MRA within limitations of mild motion artifact. Electronically Signed   By: Sebastian Ache M.D.   On: 05/05/2021 15:11   MR BRAIN WO CONTRAST  Result Date: 05/05/2021 CLINICAL DATA:  Neuro deficit, acute, stroke suspected. Slurred speech. EXAM: MRI HEAD WITHOUT CONTRAST MRA HEAD WITHOUT CONTRAST MRA OF THE NECK WITHOUT AND WITH CONTRAST TECHNIQUE: Multiplanar, multi-echo pulse sequences of the brain and surrounding structures were acquired without intravenous contrast. Angiographic images of the Circle of Willis were acquired using MRA technique without intravenous contrast. Angiographic images of the neck were acquired using MRA technique without and with intravenous contrast. Carotid stenosis measurements (when applicable) are obtained utilizing NASCET criteria, using the distal internal carotid diameter as the denominator. CONTRAST:  7.62mL GADAVIST GADOBUTROL 1 MMOL/ML IV SOLN COMPARISON:  Head CT 05/05/2020 FINDINGS: MR HEAD FINDINGS Brain: There is a small acute infarct involving the right insula. Patchy T2 hyperintensities in the cerebral white matter and pons are nonspecific but compatible with moderate chronic small vessel ischemic disease. Mild cerebral atrophy is within normal limits for age. No intracranial hemorrhage, mass, midline shift, or extra-axial fluid collection is identified. Vascular: Major intracranial vascular flow voids are preserved. Skull and upper cervical spine: Unremarkable bone marrow signal. Sinuses/Orbits: Unremarkable orbits. Moderate mucosal thickening in the left maxillary sinus. Small bilateral mastoid effusions.  Other: None. MRA HEAD FINDINGS Anterior circulation: The internal carotid arteries are widely patent from skull base to carotid termini. The ACAs and MCAs are patent without evidence of a proximal branch occlusion or significant proximal stenosis. No aneurysm is identified. Posterior circulation: The included intracranial portions of the vertebral arteries are patent to the basilar with the left being strongly dominant. The basilar artery is widely patent. There are moderate-sized posterior communicating arteries bilaterally. Both PCAs are patent without evidence of a significant proximal stenosis. No aneurysm is identified. Anatomic variants: Duplicated middle cerebral arteries bilaterally. Hypoplastic right vertebral artery. MRA NECK FINDINGS The study is mildly motion degraded. Aortic arch: Normal variant 4 vessel aortic arch with the left vertebral artery arising from the arch. Widely patent arch vessel origins. Right carotid system: Patent without evidence of a significant stenosis or dissection. Left carotid system: Patent without evidence of a significant stenosis or dissection. Vertebral arteries: The vertebral arteries are patent with antegrade flow bilaterally and with the left being dominant. Motion artifact limits assessment of the vertebral artery origins, however there is no evidence of a significant stenosis or dissection on either side more distally. IMPRESSION: 1. 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 2. Moderate chronic small vessel ischemic disease. 3. Negative head MRA. 4. Negative neck MRA within limitations of mild motion artifact. Electronically Signed   By: Sebastian Ache M.D.   On: 05/05/2021 15:11   CT HEAD CODE STROKE WO CONTRAST  Result Date: 05/05/2021 CLINICAL DATA:  Code stroke. Neuro deficit, acute, stroke suspected. Left  facial droop and slurred speech. EXAM: CT HEAD WITHOUT CONTRAST TECHNIQUE: Contiguous axial images were obtained from the base of the skull through the  vertex without intravenous contrast. COMPARISON:  None. FINDINGS: Brain: There is no evidence of an acute infarct, intracranial hemorrhage, mass, midline shift, or extra-axial fluid collection. Hypodensities in the cerebral white matter bilaterally are nonspecific but compatible with mild chronic small vessel ischemic disease. Mild cerebral atrophy is within normal limits for age. Vascular: Calcified atherosclerosis at the skull base. No hyperdense vessel. Skull: No fracture or suspicious osseous lesion. Sinuses/Orbits: Moderate mucosal thickening in the left maxillary sinus. Clear mastoid air cells. Unremarkable orbits. Other: None. ASPECTS [facility]([place] Stroke Program Early CT Score) - Ganglionic level infarction (caudate, lentiform nuclei, internal capsule, insula, M1-M3 cortex): 7 - Supraganglionic infarction (M4-M6 cortex): 3 Total score (0-10 with 10 being normal): 10 IMPRESSION: 1. No evidence of acute intracranial abnormality. 2. ASPECTS is 10. 3. Mild chronic small vessel ischemic disease. These results were communicated to Dr. Selina CooleyStack at 12:27 pm on 05/05/2021 by text page via the [facility] messaging system. Electronically Signed   By: Sebastian AcheAllen  Grady M.D.   On: 05/05/2021 12:27    ECG - SR rate 99 BPM. (See cardiology reading for complete details)   PHYSICAL EXAM  Temp:  [97.8 F (36.6 C)-99 F (37.2 C)] 98 F (36.7 C) (07/26 0816) Pulse Rate:  [70-191] 185 (07/26 1007) Resp:  [13-27] 17 (07/26 0816) BP: (102-128)/(50-96) 112/71 (07/26 1007) SpO2:  [92 %-100 %] 99 % (07/26 0854)  General - Well nourished, well developed, in no apparent distress.  Ophthalmologic - fundi not visualized due to noncooperation.  Cardiovascular - Regular rhythm and rate.  Mental Status -  Level of arousal and orientation to time, place, and person were intact. Language including expression, naming, repetition, comprehension was assessed and found intact.  Cranial Nerves II - XII - II - Visual field intact OU. III,  IV, VI - Extraocular movements intact. V - Facial sensation intact bilaterally. VII - Facial movement intact bilaterally. VIII - Hearing & vestibular intact bilaterally. X - Palate elevates symmetrically. XI - Chin turning & shoulder shrug intact bilaterally. XII - Tongue protrusion intact.  Motor Strength - The patient's strength was normal in all extremities and pronator drift was absent.  Bulk was normal and fasciculations were absent.   Motor Tone - Muscle tone was assessed at the neck and appendages and was normal.  Reflexes - The patient's reflexes were symmetrical in all extremities and she had no pathological reflexes.  Sensory - Light touch, temperature/pinprick were assessed and were symmetrical.    Coordination - The patient had normal movements in the hands with no ataxia or dysmetria.  Tremor was absent.  Gait and Station - deferred.    ASSESSMENT/PLAN Ms. Sandra ArbourBarbara Statzer is a 70 y.o. female with history of SVT on flecainide, breast cancer status post right mastectomy with no recurrence, and history of chronic bronchitis who presented with acute onset left-sided weakness, dysarthria, and drooling  She did not receive IV t-PA due to late presentation (>4.5 hours from time of onset).   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 fib as a cause of current stroke CT Head - No evidence of acute intracranial abnormality. ASPECTS is 10.  MRI head - Small acute infarct involving the right insula. Moderate chronic small vessel ischemic disease.  MRA head and Neck - Negative head and  neck MRA. 2D Echo EF 60 to 65% LE venous doppler no DVT Recommend loop recorder placement wither here or with her cardiologist Dr[facility] Sampson [facility] LDL - 57.3 HgbA1c 5.4 UDS neg VTE prophylaxis - Lovenox No antithrombotic prior to admission, now on aspirin 81 mg daily and clopidogrel 75  mg daily DAPT for 3 weeks and then aspirin alone. Ongoing aggressive stroke risk factor management Therapy recommendations:  pending Disposition:  Pending  SVT Follow-up with Dr. Sampson Goon at [facility] On home meds of Cardizem, metoprolol and flecainide Still has intermittent episodes of SVT On IV-> PO Cardizem On home flecainide and metoprolol Cardiology on board - recommend ablation with Dr. Sampson Goon Recommend loop recorder placement either here or with her cardiologist Dr. Sampson Goon at [facility]  Hypertension Home BP meds: Cardizem, Toprol  Current BP meds: Cardizem, metoprolol Mildly low at times Long-term BP goal normotensive  Hyperlipidemia Home Lipid lowering medication: Lipitor 20 mg QD LDL 57.3, goal < 70 Lipitor 20 mg daily, no need high intensity due to LDL at goal Continue statin at discharge  Tobacco abuse Current smoker Smoking cessation counseling provided Pt is willing to quit        Other Stroke Risk Factors Advanced age        Other Active Problems Code status - Full Code RA - on Arava and prednisone COPD   Hospital day # 2  Neurology will sign off. Please call with questions. Pt will follow up with stroke clinic NP at [facility] in about 4 weeks. Thanks for the consult.   Marvel Plan, MD PhD Stroke Neurology 05/08/2021 11:38 AM   To contact Stroke Continuity provider, please refer to [email or web address]. After hours, contact General Neurology

## 2021-05-08 NOTE — Progress Notes (Addendum)
Progress Note  Patient Name: Sandra Nielsen Date of Encounter: 05/08/2021  [facility] Cardiologist: None Clydie Braun  Subjective   Had episodes of SVT up to 180 last evening.  They were intermittent occurring between 8 PM and 10 PM.  Sinus rhythm since that time.  Inpatient Medications    Scheduled Meds:  aspirin EC  81 mg Oral Daily   atorvastatin  20 mg Oral Daily   clopidogrel  75 mg Oral Daily   clotrimazole   Topical BID   diltiazem  120 mg Oral BID   enoxaparin (LOVENOX) injection  40 mg Subcutaneous Q24H   flecainide  50 mg Oral BID   fluticasone furoate-vilanterol  1 puff Inhalation Daily   And   umeclidinium bromide  1 puff Inhalation Daily   leflunomide  20 mg Oral Daily   lidocaine  1 patch Transdermal Q24H   metoprolol succinate  25 mg Oral Daily   nystatin   Topical BID   pantoprazole  40 mg Oral Daily   predniSONE  5 mg Oral Q breakfast   Continuous Infusions:  PRN Meds: acetaminophen **OR** acetaminophen (TYLENOL) oral liquid 160 mg/5 mL **OR** acetaminophen, levalbuterol, metoprolol tartrate   Vital Signs    Vitals:   05/07/21 2313 05/08/21 0200 05/08/21 0333 05/08/21 0816  BP: 128/69 110/72 115/60 110/84  Pulse: 70 77 72 71  Resp: Temp: 98 F (36.7 C) 98.7 F (37.1 C) 97.8 F (36.6 C) 98 F (36.7 C)  TempSrc: Oral Oral Oral Oral  SpO2: 99%  100% 99%  Weight:      Height:        Intake/Output Summary (Last 24 hours) at 05/08/2021 0826 Last data filed at 05/07/2021 1800 Gross per 24 hour  Intake 178.64 ml  Output --  Net 178.64 ml   Last 3 Weights 05/05/2021 09/27/2013 09/23/2013  Weight (lbs) 157 lb 13.6 oz 172 lb 8 oz 164 lb  Weight (kg) 71.6 kg 78.245 kg 74.39 kg      Telemetry    Episodes of SVT up to 180- Personally Reviewed  ECG    Not repeated- Personally Reviewed  Physical Exam  Sitting for breakfast GEN: No acute distress.   Neck: No JVD Cardiac: RRR, no murmurs, rubs, or gallops.  Respiratory:  Clear to auscultation bilaterally. GI: Soft, nontender, non-distended  MS: No edema; No deformity. Neuro:  Nonfocal  Psych: Normal affect   Labs    High Sensitivity Troponin:  No results for input(s): TROPONINIHS in the last 720 hours.    Chemistry Recent Labs  Lab 05/05/21 1211 05/05/21 1219 05/07/21 1233 05/07/21 2239 05/08/21 0056  NA 135 135 135  --  133*  K 3.6 3.9 3.9 4.2 3.5  CL 98 98 100  --  101  CO2 26  --  25  --  25  GLUCOSE 172* 168* 103*  --  86  BUN 10 11 7*  --  8  CREATININE 0.90 0.70 0.83  --  0.77  CALCIUM 9.7  --  9.4  --  9.3  PROT 6.5  --   --   --   --   ALBUMIN 3.3*  --   --   --   --   AST 23  --   --   --   --   ALT 19  --   --   --   --   ALKPHOS 132*  --   --   --   --  BILITOT 0.7  --   --   --   --   GFRNONAA >60  --  >60  --  >60  ANIONGAP 11  --  10  --  7     Hematology Recent Labs  Lab 05/05/21 1211 05/05/21 1219  WBC 9.9  --   RBC 3.49*  --   HGB 12.6 13.3  HCT 38.0 39.0  MCV 108.9*  --   MCH 36.1*  --   MCHC 33.2  --   RDW 16.0*  --   PLT 293  --     BNPNo results for input(s): BNP, PROBNP in the last 168 hours.   DDimer No results for input(s): DDIMER in the last 168 hours.   Radiology    ECHOCARDIOGRAM COMPLETE BUBBLE STUDY  Result Date: 05/06/2021    ECHOCARDIOGRAM REPORT   Patient Name:   Sandra Nielsen Date of Exam: 05/06/2021 Medical Rec #:  [id number]      Height:       69.0 in Accession #:    [id number]     Weight:       157.8 lb Date of Birth:  1951-01-07      BSA:          1.868 m Patient Age:    69 years       BP:           133/62 mmHg Patient Gender: F              HR:           81 bpm. Exam Location:  Inpatient Procedure: 2D Echo, Cardiac Doppler, Color Doppler and Saline Contrast Bubble            Study Indications:    Stroke 434.91/I63.9  History:        Patient has no prior history of Echocardiogram examinations.                 Signs/Symptoms:Dizziness/Lightheadedness.  Sonographer:    Roosvelt Maser RDCS  Referring Phys: [phone] Malachi Carl STACK IMPRESSIONS  1. Left ventricular ejection fraction, by estimation, is 60 to 65%. The left ventricle has normal function. The left ventricle has no regional wall motion abnormalities. Left ventricular diastolic parameters are consistent with Grade I diastolic dysfunction (impaired relaxation).  2. Right ventricular systolic function is normal. The right ventricular size is normal. There is normal pulmonary artery systolic pressure.  3. The mitral valve is normal in structure. Mild mitral valve regurgitation. No evidence of mitral stenosis.  4. The aortic valve is tricuspid. Aortic valve regurgitation is not visualized.  5. The inferior vena cava is normal in size with greater than 50% respiratory variability, suggesting right atrial pressure of 3 mmHg. FINDINGS  Left Ventricle: Left ventricular ejection fraction, by estimation, is 60 to 65%. The left ventricle has normal function. The left ventricle has no regional wall motion abnormalities. The left ventricular internal cavity size was normal in size. There is  no left ventricular hypertrophy. Left ventricular diastolic parameters are consistent with Grade I diastolic dysfunction (impaired relaxation). Right Ventricle: The right ventricular size is normal. No increase in right ventricular wall thickness. Right ventricular systolic function is normal. There is normal pulmonary artery systolic pressure. Left Atrium: Left atrial size was normal in size. Right Atrium: Right atrial size was normal in size. Pericardium: There is no evidence of pericardial effusion. Mitral Valve: The mitral valve is normal in structure. Mild mitral valve regurgitation. No evidence of mitral  valve stenosis. Tricuspid Valve: The tricuspid valve is normal in structure. Tricuspid valve regurgitation is mild . No evidence of tricuspid stenosis. Aortic Valve: The aortic valve is tricuspid. Aortic valve regurgitation is not visualized. Aortic valve mean  gradient measures 4.0 mmHg. Aortic valve peak gradient measures 8.0 mmHg. Aortic valve area, by VTI measures 2.25 cm. Pulmonic Valve: The pulmonic valve was not well visualized. Pulmonic valve regurgitation is not visualized. No evidence of pulmonic stenosis. Aorta: The aortic root is normal in size and structure. Venous: The inferior vena cava is normal in size with greater than 50% respiratory variability, suggesting right atrial pressure of 3 mmHg. IAS/Shunts: No atrial level shunt detected by color flow Doppler. Agitated saline contrast was given intravenously to evaluate for intracardiac shunting.  LEFT VENTRICLE PLAX 2D LVIDd:         4.50 cm  Diastology LVIDs:         2.90 cm  LV e' medial:    7.40 cm/s LV PW:         1.00 cm  LV E/e' medial:  9.0 LV IVS:        1.00 cm  LV e' lateral:   8.38 cm/s LVOT diam:     1.90 cm  LV E/e' lateral: 8.0 LV SV:         62 LV SV Index:   33 LVOT Area:     2.84 cm                          3D Volume EF:                         3D EF:        60 %                         LV EDV:       121 ml                         LV ESV:       48 ml                         LV SV:        73 ml RIGHT VENTRICLE RV Basal diam:  3.20 cm LEFT ATRIUM           Index       RIGHT ATRIUM           Index LA diam:      3.60 cm 1.93 cm/m  RA Area:     12.70 cm LA Vol (A2C): 40.7 ml 21.78 ml/m RA Volume:   28.00 ml  14.99 ml/m  AORTIC VALVE AV Area (Vmax):    2.23 cm AV Area (Vmean):   2.18 cm AV Area (VTI):     2.25 cm AV Vmax:           141.00 cm/s AV Vmean:          97.700 cm/s AV VTI:            0.274 m AV Peak Grad:      8.0 mmHg AV Mean Grad:      4.0 mmHg LVOT Vmax:         111.00 cm/s LVOT Vmean:        75.200 cm/s LVOT VTI:  0.217 m LVOT/AV VTI ratio: 0.79  AORTA Ao Root diam: 3.10 cm Ao Asc diam:  3.20 cm MITRAL VALVE MV Area (PHT): 4.39 cm     SHUNTS MV Decel Time: 173 msec     Systemic VTI:  0.22 m MV E velocity: 66.80 cm/s   Systemic Diam: 1.90 cm MV A velocity: 139.00 cm/s  MV E/A ratio:  0.48 Dina Rich MD Electronically signed by Dina Rich MD Signature Date/Time: 05/06/2021/1:24:17 PM    Final    VAS [place] LOWER EXTREMITY VENOUS (DVT)  Result Date: 05/07/2021  Lower Venous DVT Study Patient Name:  Sandra Nielsen  Date of Exam:   05/06/2021 Medical Rec #: [id number]       Accession #:    [id number] Date of Birth: 1951/07/17       Patient Gender: F Patient Age:   69Y Exam Location:  [facility] Procedure:      VAS [place] LOWER EXTREMITY VENOUS (DVT) Referring Phys: [id number] Marvel Plan --------------------------------------------------------------------------------  Indications: Stroke.  Comparison Study: No previous exams Performing Technologist: Jody Hill RVT, RDMS  Examination Guidelines: A complete evaluation includes B-mode imaging, spectral Doppler, color Doppler, and power Doppler as needed of all accessible portions of each vessel. Bilateral testing is considered an integral part of a complete examination. Limited examinations for reoccurring indications may be performed as noted. The reflux portion of the exam is performed with the patient in reverse Trendelenburg.  +---------+---------------+---------+-----------+----------+----------------+ RIGHT    CompressibilityPhasicitySpontaneityPropertiesThrombus Aging   +---------+---------------+---------+-----------+----------+----------------+ CFV      Full           Yes      Yes                                   +---------+---------------+---------+-----------+----------+----------------+ SFJ      Full                                                          +---------+---------------+---------+-----------+----------+----------------+ FV Prox  Full           Yes      Yes                                   +---------+---------------+---------+-----------+----------+----------------+ FV Mid   Full           Yes      Yes                                    +---------+---------------+---------+-----------+----------+----------------+ FV DistalFull           Yes      Yes                                   +---------+---------------+---------+-----------+----------+----------------+ PFV      Full                                                          +---------+---------------+---------+-----------+----------+----------------+  POP      Full           Yes      Yes                                   +---------+---------------+---------+-----------+----------+----------------+ PTV      Full                                                          +---------+---------------+---------+-----------+----------+----------------+ PERO                                                  Not seen on exam +---------+---------------+---------+-----------+----------+----------------+   Right Technical Findings: Not visualized segments include peroneal veins.  +---------+---------------+---------+-----------+----------+-------------------+ LEFT     CompressibilityPhasicitySpontaneityPropertiesThrombus Aging      +---------+---------------+---------+-----------+----------+-------------------+ CFV      Full           Yes      Yes                                      +---------+---------------+---------+-----------+----------+-------------------+ SFJ      Full                                                             +---------+---------------+---------+-----------+----------+-------------------+ FV Prox  Full           Yes      Yes                                      +---------+---------------+---------+-----------+----------+-------------------+ FV Mid   Full           Yes      Yes                                      +---------+---------------+---------+-----------+----------+-------------------+ FV DistalFull           Yes      Yes                                       +---------+---------------+---------+-----------+----------+-------------------+ PFV      Full                                                             +---------+---------------+---------+-----------+----------+-------------------+ POP      Full           Yes  Yes                                      +---------+---------------+---------+-----------+----------+-------------------+ PTV      Full                                                             +---------+---------------+---------+-----------+----------+-------------------+ PERO     Full                                         Not well visualized +---------+---------------+---------+-----------+----------+-------------------+     Summary: BILATERAL: - No evidence of deep vein thrombosis seen in the lower extremities, bilaterally. - No evidence of superficial venous thrombosis in the lower extremities, bilaterally. -No evidence of popliteal cyst, bilaterally.   *See table(s) above for measurements and observations. Electronically signed by Fabienne Brunsharles Fields MD on 05/07/2021 at 4:33:53 PM.    Final     Cardiac Studies   Echo demonstrates normal LVEF  Patient Profile     70 y.o. female  with a hx of SVT/PVCs, COPD, HLD, HTN, RA, and ongoing tobacco uise who was admitted with CVA and is being seen 05/07/2021 for the evaluation of SVT at the request of Dr. Sherryll BurgerShah.  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  Current plan is to maintain the patient on preadmission antiarrhythmic regimen of diltiazem/Toprol-XL/and flecainide.  Recommend follow-up with Dr. Sampson GoonFitzgerald ([facility] for further discussion concerning ablation to simplify medical regimen. CVA: Dr. Ladona Ridgelaylor raises the possibility of silent atrial fibrillation that could have caused an embolic CVA.  Consider loop recorder.   A. fib has not been demonstrated since being in the hospital. Tobacco abuse: Smoking cessation is imperative.  [facility] will sign off.   Medication Recommendations: Continue typical antiarrhythmic regimen as prior to admission. Other recommendations (labs, testing, etc): Follow-up with Dr. Clydie Braunavid Fitzgerald for consideration of ablation and loop recorder to rule out silent A. fib as a source of stroke. Follow up as an outpatient: Follow-up with Dr. Sampson GoonFitzgerald  For questions or updates, please contact [facility] Please consult [email or web address] for contact info under        Signed, Lesleigh NoeHenry W William Laske III, MD  05/08/2021, 8:26 AM

## 2021-05-08 NOTE — Progress Notes (Signed)
PROGRESS NOTE    Sandra Nielsen  OIN:[id number] DOB: [birth year] DOA: 05/05/2021 PCP: Shellia Cleverly, PA   Brief Narrative:   Sandra Nielsen is a 70 y.o. female with medical history significant of SVT on flecainide, breast cancer s/p right mastectomy, chronic bronchitis, RA, HLD and tobacco abuse who presented to ER with slurred speech and drooling that started around 9AM this morning.  Work-up revealed small acute infarct in the right insula.    recurrent SVT- cardiology evaluation was requested.  She was about to receive adenosine at which point she converted to sinus rhythm spontaneously.  Assessment & Plan:   Principal Problem:   Acute stroke due to ischemia [facility]) Active Problems:   Chronic bronchitis (HCC)   Closed fracture of single pubic ramus of pelvis, initial encounter (HCC)   Dyslipidemia   Frequent PVCs   Rheumatoid arthritis with positive rheumatoid factor (HCC)   Tobacco abuse   Neck pain on right side   Intertriginous candidiasis   CVA (cerebral vascular accident) (HCC)   Acute stroke -MRI brain revealed small acute infarct right insula, negative head and neck MRA -Echo performed 7/24 with LVEF 60-65% and grade 1 diastolic dysfunction -Lower extremity DVT negative  -Stroke team following -Continue aspirin, Plavix, Lipitor: now on aspirin 81 mg daily and clopidogrel 75 mg daily DAPT for 3 weeks and then aspirin alone -Implantable loop recorder will likely be placed by her cardiologist Dr. Sampson Goon  SVT-recurrent -Has history of SVT, is on flecainide, Toprol, Cardizem at home -Continue flecainide, Toprol, and home Cardizem -increased BB -Appreciate cardiology evaluation and recommendations, may require ablation if heart rates do not become better controlled on medications -replace K and Mg   Rheumatoid arthritis -Continue Arava, prednisone   GERD -Continue PPI     DVT prophylaxis: Lovenox Code Status: Full Disposition Plan:  Status is:  Inpatient  Remains inpatient appropriate because:Ongoing diagnostic testing needed not appropriate for outpatient work up, IV treatments appropriate due to intensity of illness or inability to take PO, and Inpatient level of care appropriate due to severity of illness  Dispo: The patient is from: Home              Anticipated d/c is to: Home              Patient currently is not medically stable to d/c.   Difficult to place patient No   Consultants:  Neurology Cardiology    Subjective: HR elevated and patient with symptoms-- give IV adenosine with improvement  Objective: Vitals:   05/08/21 0816 05/08/21 0854 05/08/21 1007 05/08/21 1124  BP: 110/84  112/71 108/71  Pulse: 71  (!) 185 82  Resp: 17   20  Temp: 98 F (36.7 C)   97.7 F (36.5 C)  TempSrc: Oral   Oral  SpO2: 99% 99%  100%  Weight:      Height:        Intake/Output Summary (Last 24 hours) at 05/08/2021 1258 Last data filed at 05/08/2021 1005 Gross per 24 hour  Intake 658.64 ml  Output --  Net 658.64 ml   Filed Weights   05/05/21 1235  Weight: 71.6 kg    Examination:   General: Appearance:    Well developed, well nourished female in no acute distress     Lungs:     respirations unlabored  Heart:    HR 180s   MS:   All extremities are intact.    Neurologic:   Awake, alert, oriented x 3  Data Reviewed: I have personally reviewed following labs and imaging studies  CBC: Recent Labs  Lab 05/05/21 1211 05/05/21 1219  WBC 9.9  --   NEUTROABS 8.1*  --   HGB 12.6 13.3  HCT 38.0 39.0  MCV 108.9*  --   PLT 293  --    Basic Metabolic Panel: Recent Labs  Lab 05/05/21 1211 05/05/21 1219 05/07/21 1233 05/07/21 2239 05/08/21 0056  NA 135 135 135  --  133*  K 3.6 3.9 3.9 4.2 3.5  CL 98 98 100  --  101  CO2 26  --  25  --  25  GLUCOSE 172* 168* 103*  --  86  BUN 10 11 7*  --  8  CREATININE 0.90 0.70 0.83  --  0.77  CALCIUM 9.7  --  9.4  --  9.3  MG  --   --   --  1.8 1.7    GFR: Estimated Creatinine Clearance: 69.4 mL/min (by C-G formula based on SCr of 0.77 mg/dL). Liver Function Tests: Recent Labs  Lab 05/05/21 1211  AST 23  ALT 19  ALKPHOS 132*  BILITOT 0.7  PROT 6.5  ALBUMIN 3.3*   No results for input(s): LIPASE, AMYLASE in the last 168 hours. No results for input(s): AMMONIA in the last 168 hours. Coagulation Profile: Recent Labs  Lab 05/05/21 1211  INR 1.0   Cardiac Enzymes: No results for input(s): CKTOTAL, CKMB, CKMBINDEX, TROPONINI in the last 168 hours. BNP (last 3 results) No results for input(s): PROBNP in the last 8760 hours. HbA1C: Recent Labs    05/05/21 1736  HGBA1C 5.4   CBG: Recent Labs  Lab 05/05/21 1209  GLUCAP 205*   Lipid Profile: Recent Labs    05/05/21 1736  LDLDIRECT 57.3   Thyroid Function Tests: Recent Labs    05/05/21 1736  TSH 1.007   Anemia Panel: No results for input(s): VITAMINB12, FOLATE, FERRITIN, TIBC, IRON, RETICCTPCT in the last 72 hours. Sepsis Labs: No results for input(s): PROCALCITON, LATICACIDVEN in the last 168 hours.  Recent Results (from the past 240 hour(s))  SARS CORONAVIRUS 2 (TAT 6-24 HRS) Nasopharyngeal Nasopharyngeal Swab     Status: None   Collection Time: 05/05/21  2:08 PM   Specimen: Nasopharyngeal Swab  Result Value Ref Range Status   SARS Coronavirus 2 NEGATIVE NEGATIVE Final    Comment: (NOTE) SARS-CoV-2 target nucleic acids are NOT DETECTED.  The SARS-CoV-2 RNA is generally detectable in upper and lower respiratory specimens during the acute phase of infection. Negative results do not preclude SARS-CoV-2 infection, do not rule out co-infections with other pathogens, and should not be used as the sole basis for treatment or other patient management decisions. Negative results must be combined with clinical observations, patient history, and epidemiological information. The expected result is Negative.  Fact Sheet for  Patients: HairSlick.no  Fact Sheet for Healthcare Providers: quierodirigir.com  This test is not yet approved or cleared by the [place] FDA and  has been authorized for detection and/or diagnosis of SARS-CoV-2 by FDA under an Emergency Use Authorization (EUA). This EUA will remain  in effect (meaning this test can be used) for the duration of the COVID-19 declaration under Se ction 564(b)(1) of the Act, 21 U.S.C. section 360bbb-3(b)(1), unless the authorization is terminated or revoked sooner.  Performed at [facility], [address] [place], [place] [address]          Radiology Studies: ECHOCARDIOGRAM COMPLETE BUBBLE STUDY  Result Date: 05/06/2021    ECHOCARDIOGRAM REPORT   Patient Name:   CLOVA MORLOCK Date of Exam: 05/06/2021 Medical Rec #:  [id number]      Height:       69.0 in Accession #:    [id number]     Weight:       157.8 lb Date of Birth:  31-Dec-1950      BSA:          1.868 m Patient Age:    69 years       BP:           133/62 mmHg Patient Gender: F              HR:           81 bpm. Exam Location:  Inpatient Procedure: 2D Echo, Cardiac Doppler, Color Doppler and Saline Contrast Bubble            Study Indications:    Stroke 434.91/I63.9  History:        Patient has no prior history of Echocardiogram examinations.                 Signs/Symptoms:Dizziness/Lightheadedness.  Sonographer:    Roosvelt Maser RDCS Referring Phys: [phone] Malachi Carl STACK IMPRESSIONS  1. Left ventricular ejection fraction, by estimation, is 60 to 65%. The left ventricle has normal function. The left ventricle has no regional wall motion abnormalities. Left ventricular diastolic parameters are consistent with Grade I diastolic dysfunction (impaired relaxation).  2. Right ventricular systolic function is normal. The right ventricular size is normal. There is normal pulmonary artery systolic pressure.  3. The mitral valve is normal in structure.  Mild mitral valve regurgitation. No evidence of mitral stenosis.  4. The aortic valve is tricuspid. Aortic valve regurgitation is not visualized.  5. The inferior vena cava is normal in size with greater than 50% respiratory variability, suggesting right atrial pressure of 3 mmHg. FINDINGS  Left Ventricle: Left ventricular ejection fraction, by estimation, is 60 to 65%. The left ventricle has normal function. The left ventricle has no regional wall motion abnormalities. The left ventricular internal cavity size was normal in size. There is  no left ventricular hypertrophy. Left ventricular diastolic parameters are consistent with Grade I diastolic dysfunction (impaired relaxation). Right Ventricle: The right ventricular size is normal. No increase in right ventricular wall thickness. Right ventricular systolic function is normal. There is normal pulmonary artery systolic pressure. Left Atrium: Left atrial size was normal in size. Right Atrium: Right atrial size was normal in size. Pericardium: There is no evidence of pericardial effusion. Mitral Valve: The mitral valve is normal in structure. Mild mitral valve regurgitation. No evidence of mitral valve stenosis. Tricuspid Valve: The tricuspid valve is normal in structure. Tricuspid valve regurgitation is mild . No evidence of tricuspid stenosis. Aortic Valve: The aortic valve is tricuspid. Aortic valve regurgitation is not visualized. Aortic valve mean gradient measures 4.0 mmHg. Aortic valve peak gradient measures 8.0 mmHg. Aortic valve area, by VTI measures 2.25 cm. Pulmonic Valve: The pulmonic valve was not well visualized. Pulmonic valve regurgitation is not visualized. No evidence of pulmonic stenosis. Aorta: The aortic root is normal in size and structure. Venous: The inferior vena cava is normal in size with greater than 50% respiratory variability, suggesting right atrial pressure of 3 mmHg. IAS/Shunts: No atrial level shunt detected by color flow Doppler.  Agitated saline contrast was given intravenously to evaluate for intracardiac shunting.  LEFT VENTRICLE PLAX 2D LVIDd:  4.50 cm  Diastology LVIDs:         2.90 cm  LV e' medial:    7.40 cm/s LV PW:         1.00 cm  LV E/e' medial:  9.0 LV IVS:        1.00 cm  LV e' lateral:   8.38 cm/s LVOT diam:     1.90 cm  LV E/e' lateral: 8.0 LV SV:         62 LV SV Index:   33 LVOT Area:     2.84 cm                          3D Volume EF:                         3D EF:        60 %                         LV EDV:       121 ml                         LV ESV:       48 ml                         LV SV:        73 ml RIGHT VENTRICLE RV Basal diam:  3.20 cm LEFT ATRIUM           Index       RIGHT ATRIUM           Index LA diam:      3.60 cm 1.93 cm/m  RA Area:     12.70 cm LA Vol (A2C): 40.7 ml 21.78 ml/m RA Volume:   28.00 ml  14.99 ml/m  AORTIC VALVE AV Area (Vmax):    2.23 cm AV Area (Vmean):   2.18 cm AV Area (VTI):     2.25 cm AV Vmax:           141.00 cm/s AV Vmean:          97.700 cm/s AV VTI:            0.274 m AV Peak Grad:      8.0 mmHg AV Mean Grad:      4.0 mmHg LVOT Vmax:         111.00 cm/s LVOT Vmean:        75.200 cm/s LVOT VTI:          0.217 m LVOT/AV VTI ratio: 0.79  AORTA Ao Root diam: 3.10 cm Ao Asc diam:  3.20 cm MITRAL VALVE MV Area (PHT): 4.39 cm     SHUNTS MV Decel Time: 173 msec     Systemic VTI:  0.22 m MV E velocity: 66.80 cm/s   Systemic Diam: 1.90 cm MV A velocity: 139.00 cm/s MV E/A ratio:  0.48 Dina RichJonathan Branch MD Electronically signed by Dina RichJonathan Branch MD Signature Date/Time: 05/06/2021/1:24:17 PM    Final    VAS US LOWER EXTREMITY VENOUS (DVT)  Result Date: 05/07/2021  Lower Venous DVT Study Patient Name:  Marguarite ArbourBARBARA Ovitt  Date of Exam:   05/06/2021 Medical Rec #: [id number]       Accession #:    [id number] Date of Birth: [birth year]  Patient Gender: F Patient Age:   1Y Exam Location:  [facility] Procedure:      VAS [place] LOWER EXTREMITY VENOUS (DVT) Referring Phys: [id number]  Scheryl Marten XU --------------------------------------------------------------------------------  Indications: Stroke.  Comparison Study: No previous exams Performing Technologist: Jody Hill RVT, RDMS  Examination Guidelines: A complete evaluation includes B-mode imaging, spectral Doppler, color Doppler, and power Doppler as needed of all accessible portions of each vessel. Bilateral testing is considered an integral part of a complete examination. Limited examinations for reoccurring indications may be performed as noted. The reflux portion of the exam is performed with the patient in reverse Trendelenburg.  +---------+---------------+---------+-----------+----------+----------------+ RIGHT    CompressibilityPhasicitySpontaneityPropertiesThrombus Aging   +---------+---------------+---------+-----------+----------+----------------+ CFV      Full           Yes      Yes                                   +---------+---------------+---------+-----------+----------+----------------+ SFJ      Full                                                          +---------+---------------+---------+-----------+----------+----------------+ FV Prox  Full           Yes      Yes                                   +---------+---------------+---------+-----------+----------+----------------+ FV Mid   Full           Yes      Yes                                   +---------+---------------+---------+-----------+----------+----------------+ FV DistalFull           Yes      Yes                                   +---------+---------------+---------+-----------+----------+----------------+ PFV      Full                                                          +---------+---------------+---------+-----------+----------+----------------+ POP      Full           Yes      Yes                                   +---------+---------------+---------+-----------+----------+----------------+ PTV       Full                                                          +---------+---------------+---------+-----------+----------+----------------+  PERO                                                  Not seen on exam +---------+---------------+---------+-----------+----------+----------------+   Right Technical Findings: Not visualized segments include peroneal veins.  +---------+---------------+---------+-----------+----------+-------------------+ LEFT     CompressibilityPhasicitySpontaneityPropertiesThrombus Aging      +---------+---------------+---------+-----------+----------+-------------------+ CFV      Full           Yes      Yes                                      +---------+---------------+---------+-----------+----------+-------------------+ SFJ      Full                                                             +---------+---------------+---------+-----------+----------+-------------------+ FV Prox  Full           Yes      Yes                                      +---------+---------------+---------+-----------+----------+-------------------+ FV Mid   Full           Yes      Yes                                      +---------+---------------+---------+-----------+----------+-------------------+ FV DistalFull           Yes      Yes                                      +---------+---------------+---------+-----------+----------+-------------------+ PFV      Full                                                             +---------+---------------+---------+-----------+----------+-------------------+ POP      Full           Yes      Yes                                      +---------+---------------+---------+-----------+----------+-------------------+ PTV      Full                                                             +---------+---------------+---------+-----------+----------+-------------------+ PERO     Full  Not well visualized +---------+---------------+---------+-----------+----------+-------------------+     Summary: BILATERAL: - No evidence of deep vein thrombosis seen in the lower extremities, bilaterally. - No evidence of superficial venous thrombosis in the lower extremities, bilaterally. -No evidence of popliteal cyst, bilaterally.   *See table(s) above for measurements and observations. Electronically signed by Fabienne Bruns MD on 05/07/2021 at 4:33:53 PM.    Final         Scheduled Meds:  aspirin EC  81 mg Oral Daily   atorvastatin  20 mg Oral Daily   clopidogrel  75 mg Oral Daily   clotrimazole   Topical BID   diltiazem  120 mg Oral BID   enoxaparin (LOVENOX) injection  40 mg Subcutaneous Q24H   flecainide  50 mg Oral BID   fluticasone furoate-vilanterol  1 puff Inhalation Daily   And   umeclidinium bromide  1 puff Inhalation Daily   leflunomide  20 mg Oral Daily   lidocaine  1 patch Transdermal Q24H   metoprolol succinate  25 mg Oral BID   nystatin   Topical BID   pantoprazole  40 mg Oral Daily   potassium chloride  40 mEq Oral Once   predniSONE  5 mg Oral Q breakfast     LOS: 2 days    Time spent: 35 minutes    Joseph Art, DO [facility]  If 7PM-7AM, please contact night-coverage [email or web address] 05/08/2021, 12:58 PM

## 2021-05-08 NOTE — Progress Notes (Signed)
Physical Therapy Treatment Patient Details Name: Sandra Nielsen MRN: [id number] DOB: 07/05/1951 Today's Date: 05/08/2021    History of Present Illness 70 yo female admitted with slurred speech L mouth droop and drooling. MRI acute R insula infarct  PMH: SVT, breast CA s/p R mastectomy, chronic bronchitis, RA, HLD, tobacco abuse, COVID, pelvis fx (03/11/2021),  wears glasses    PT Comments    Pt HR remained stable throughout session today. However, of note, pt was performing cervical ROM activities to address her chronic neck pain when she experienced facial asymmetry and aphasia for approx 15 secs. She was instructed to sit down and these symptoms resolved and did not reoccur. Pt ambulated 150' with RW and min-guard A 2x with seated rest in between. HR between 84-97 bpm, SPO2 100% on RA. PT will continue to follow.   Follow Up Recommendations  Home health PT (continue)     Equipment Recommendations  None recommended by PT    Recommendations for Other Services       Precautions / Restrictions Precautions Precautions: Fall Restrictions Weight Bearing Restrictions: No    Mobility  Bed Mobility Overal bed mobility: Needs Assistance Bed Mobility: Supine to Sit     Supine to sit: Modified independent (Device/Increase time)     General bed mobility comments: pt able to come to EOB independently    Transfers Overall transfer level: Needs assistance Equipment used: Rolling walker (2 wheeled) Transfers: Sit to/from Stand Sit to Stand: Min guard         General transfer comment: Min Guard A for safety, close monitoring of VS  Ambulation/Gait Ambulation/Gait assistance: Min guard Gait Distance (Feet): 150 Feet (2x with seated rest in between) Assistive device: Rolling walker (2 wheeled) Gait Pattern/deviations: Antalgic Gait velocity: decreased Gait velocity interpretation: <1.8 ft/sec, indicate of risk for recurrent falls General Gait Details: pt with smoother gait  pattern today than yesterday. HR 85-97 bpm   Stairs             Wheelchair Mobility    Modified Rankin (Stroke Patients Only) Modified Rankin (Stroke Patients Only) Pre-Morbid Rankin Score: No symptoms Modified Rankin: Moderately severe disability     Balance Overall balance assessment: Mild deficits observed, not formally tested                                          Cognition Arousal/Alertness: Awake/alert Behavior During Therapy: WFL for tasks assessed/performed Overall Cognitive Status: Within Functional Limits for tasks assessed                                 General Comments: Pt pleasant and conversational throughout session      Exercises Other Exercises Other Exercises: standing BUE flexion x5 Other Exercises: standing side bending    General Comments General comments (skin integrity, edema, etc.): HR did not exceed 97 bpm in session, SPO2 100% on RA      Pertinent Vitals/Pain Pain Assessment: Faces Faces Pain Scale: Hurts little more Pain Location: low back, neck Pain Descriptors / Indicators: Discomfort Pain Intervention(s): Limited activity within patient's tolerance;Monitored during session    Home Living                      Prior Function            PT  Goals (current goals can now be found in the care plan section) Acute Rehab PT Goals Patient Stated Goal: get HR back under control PT Goal Formulation: With patient Time For Goal Achievement: 05/21/21 Potential to Achieve Goals: Good Progress towards PT goals: Progressing toward goals    Frequency    Min 4X/week      PT Plan Current plan remains appropriate    Co-evaluation              AM-PAC PT "6 Clicks" Mobility   Outcome Measure  Help needed turning from your back to your side while in a flat bed without using bedrails?: None Help needed moving from lying on your back to sitting on the side of a flat bed without using  bedrails?: None Help needed moving to and from a bed to a chair (including a wheelchair)?: A Little Help needed standing up from a chair using your arms (e.g., wheelchair or bedside chair)?: A Little Help needed to walk in hospital room?: A Little Help needed climbing 3-5 steps with a railing? : A Little 6 Click Score: 20    End of Session Equipment Utilized During Treatment: Gait belt Activity Tolerance: Patient tolerated treatment well Patient left: in chair;with call bell/phone within reach;with chair alarm set Nurse Communication: Mobility status PT Visit Diagnosis: Difficulty in walking, not elsewhere classified (R26.2);Pain Pain - part of body:  (back, neck)     Time: 7846-9629 PT Time Calculation (min) (ACUTE ONLY): 18 min  Charges:  $Gait Training: 8-22 mins                     Lyanne Co, PT  Acute Rehab Services  Pager [phone] Office [phone]    Lawana Chambers Raima Geathers 05/08/2021, 1:46 PM

## 2021-05-08 NOTE — Plan of Care (Signed)
  Problem: Education: Goal: Knowledge of disease or condition will improve Outcome: Progressing Goal: Knowledge of secondary prevention will improve Outcome: Progressing Goal: Knowledge of patient specific risk factors addressed and post discharge goals established will improve Outcome: Progressing   Problem: Coping: Goal: Will identify appropriate support needs Outcome: Progressing   Problem: Self-Care: Goal: Ability to participate in self-care as condition permits will improve Outcome: Progressing Goal: Ability to communicate needs accurately will improve Outcome: Progressing

## 2021-05-09 ENCOUNTER — Other Ambulatory Visit (HOSPITAL_COMMUNITY): Payer: Self-pay

## 2021-05-09 DIAGNOSIS — I639 Cerebral infarction, unspecified: Secondary | ICD-10-CM | POA: Diagnosis not present

## 2021-05-09 LAB — BASIC METABOLIC PANEL
Anion gap: 7 (ref 5–15)
BUN: 7 mg/dL — ABNORMAL LOW (ref 8–23)
CO2: 27 mmol/L (ref 22–32)
Calcium: 9.3 mg/dL (ref 8.9–10.3)
Chloride: 100 mmol/L (ref 98–111)
Creatinine, Ser: 0.83 mg/dL (ref 0.44–1.00)
GFR, Estimated: >60 mL/min (ref >60)
Glucose, Bld: 104 mg/dL — ABNORMAL HIGH (ref 70–99)
Potassium: 3.9 mmol/L (ref 3.5–5.1)
Sodium: 134 mmol/L — ABNORMAL LOW (ref 135–145)

## 2021-05-09 LAB — MAGNESIUM: Magnesium: 2 mg/dL (ref 1.7–2.4)

## 2021-05-09 MED ORDER — METOPROLOL SUCCINATE ER 25 MG PO TB24: ORAL_TABLET | ORAL | Status: AC

## 2021-05-09 MED ORDER — METOPROLOL SUCCINATE ER 25 MG PO TB24
12.5000 mg | ORAL_TABLET | Freq: Once | ORAL | Status: AC
  Administered 2021-05-09: 12.5 mg via ORAL

## 2021-05-09 MED ORDER — METOPROLOL SUCCINATE ER 25 MG PO TB24: 25.0000 mg | ORAL_TABLET | Freq: Every day | ORAL | Status: DC

## 2021-05-09 MED ORDER — METOPROLOL SUCCINATE ER 25 MG PO TB24: 12.5000 mg | ORAL_TABLET | Freq: Every day | ORAL | Status: DC

## 2021-05-09 MED ORDER — PREDNISONE 5 MG PO TABS: 5.0000 mg | ORAL_TABLET | Freq: Every day | ORAL | Status: AC

## 2021-05-09 MED ORDER — CLOPIDOGREL BISULFATE 75 MG PO TABS
75.0000 mg | ORAL_TABLET | Freq: Every day | ORAL | 0 refills | Status: DC
  Filled 2021-05-09: qty 21, 21d supply, fill #0

## 2021-05-09 MED ORDER — ASPIRIN 81 MG PO TBEC: 81.0000 mg | DELAYED_RELEASE_TABLET | Freq: Every day | ORAL | 11 refills | Status: AC

## 2021-05-09 NOTE — Progress Notes (Signed)
Physical Therapy Treatment Patient Details Name: Sandra Nielsen MRN: [id number] DOB: 06/28/1951 Today's Date: 05/09/2021    History of Present Illness 70 yo female admitted with slurred speech L mouth droop and drooling. MRI acute R insula infarct  PMH: SVT, breast CA s/p R mastectomy, chronic bronchitis, RA, HLD, tobacco abuse, COVID, pelvis fx (03/11/2021),  wears glasses    PT Comments    Patient received in recliner, she is hopeful to go home today. Agrees to PT session. Patient performed sit to stand transfer with supervision. Ambulated 200 feet with RW and min guard to supervision. No lob, ambulates with quick pace. Slightly fatigued, but did not require rest break. HR remained in 80s throughout session. She will continue to benefit from skilled PT while here to improve strength, safety and activity tolerance.       Follow Up Recommendations  Home health PT     Equipment Recommendations  None recommended by PT    Recommendations for Other Services       Precautions / Restrictions Precautions Precautions: Fall Restrictions Weight Bearing Restrictions: No    Mobility  Bed Mobility               General bed mobility comments: patient received sitting up in recliner and remained in recliner at end of session    Transfers Overall transfer level: Modified independent Equipment used: None Transfers: Sit to/from Stand Sit to Stand: Modified independent (Device/Increase time)            Ambulation/Gait Ambulation/Gait assistance: Min guard;Supervision Gait Distance (Feet): 200 Feet Assistive device: Rolling walker (2 wheeled) Gait Pattern/deviations: Step-through pattern Gait velocity: quick pace, WNL   General Gait Details: patient ambulates without lob, HR remained in 80s throughout session.   Stairs             Wheelchair Mobility    Modified Rankin (Stroke Patients Only) Modified Rankin (Stroke Patients Only) Pre-Morbid Rankin Score: No  symptoms Modified Rankin: Moderately severe disability     Balance Overall balance assessment: Mild deficits observed, not formally tested                                          Cognition Arousal/Alertness: Awake/alert Behavior During Therapy: WFL for tasks assessed/performed Overall Cognitive Status: Within Functional Limits for tasks assessed                                 General Comments: Pt pleasant and conversational throughout session      Exercises      General Comments        Pertinent Vitals/Pain Pain Assessment: No/denies pain    Home Living                      Prior Function            PT Goals (current goals can now be found in the care plan section) Acute Rehab PT Goals Patient Stated Goal: to hopefully go home today PT Goal Formulation: With patient Time For Goal Achievement: 05/21/21 Potential to Achieve Goals: Good Progress towards PT goals: Progressing toward goals    Frequency    Min 4X/week      PT Plan Current plan remains appropriate    Co-evaluation  AM-PAC PT "6 Clicks" Mobility   Outcome Measure  Help needed turning from your back to your side while in a flat bed without using bedrails?: None Help needed moving from lying on your back to sitting on the side of a flat bed without using bedrails?: None Help needed moving to and from a bed to a chair (including a wheelchair)?: A Little Help needed standing up from a chair using your arms (e.g., wheelchair or bedside chair)?: None Help needed to walk in hospital room?: A Little Help needed climbing 3-5 steps with a railing? : A Little 6 Click Score: 21    End of Session Equipment Utilized During Treatment: Gait belt Activity Tolerance: Patient tolerated treatment well Patient left: in chair;with call bell/phone within reach Nurse Communication: Mobility status PT Visit Diagnosis: Difficulty in walking, not elsewhere  classified (R26.2)     Time: 5176-1607 PT Time Calculation (min) (ACUTE ONLY): 12 min  Charges:  $Gait Training: 8-22 mins                    [employer], PT, G[employer] 05/09/21,12:04 PM

## 2021-05-09 NOTE — Plan of Care (Signed)
  Problem: Education: Goal: Knowledge of disease or condition will improve Outcome: Progressing Goal: Knowledge of secondary prevention will improve Outcome: Progressing Goal: Knowledge of patient specific risk factors addressed and post discharge goals established will improve Outcome: Progressing   Problem: Coping: Goal: Will identify appropriate support needs Outcome: Progressing   Problem: Self-Care: Goal: Ability to participate in self-care as condition permits will improve Outcome: Progressing Goal: Ability to communicate needs accurately will improve Outcome: Progressing   

## 2021-05-09 NOTE — Plan of Care (Signed)
  Problem: Education: Goal: Knowledge of disease or condition will improve Outcome: Progressing Goal: Knowledge of secondary prevention will improve Outcome: Progressing Goal: Knowledge of patient specific risk factors addressed and post discharge goals established will improve Outcome: Progressing   Problem: Coping: Goal: Will identify appropriate support needs Outcome: Progressing   Problem: Self-Care: Goal: Ability to participate in self-care as condition permits will improve Outcome: Progressing Goal: Ability to communicate needs accurately will improve Outcome: Progressing   Problem: Education: Goal: Knowledge of General Education information will improve Description: Including pain rating scale, medication(s)/side effects and non-pharmacologic comfort measures Outcome: Progressing   Problem: Clinical Measurements: Goal: Ability to maintain clinical measurements within normal limits will improve Outcome: Progressing Goal: Will remain free from infection Outcome: Progressing Goal: Diagnostic test results will improve Outcome: Progressing Goal: Respiratory complications will improve Outcome: Progressing Goal: Cardiovascular complication will be avoided Outcome: Progressing   Problem: Activity: Goal: Risk for activity intolerance will decrease Outcome: Progressing   Problem: Nutrition: Goal: Adequate nutrition will be maintained Outcome: Progressing   Problem: Coping: Goal: Level of anxiety will decrease Outcome: Progressing   Problem: Pain Managment: Goal: General experience of comfort will improve Outcome: Progressing   Problem: Safety: Goal: Ability to remain free from injury will improve Outcome: Progressing

## 2021-05-09 NOTE — Progress Notes (Signed)
Patient discharged home, transported by family, discharge papers explained to patient, toc provided patient with medications for home.

## 2021-05-09 NOTE — Progress Notes (Signed)
Progress Note  Patient Name: Sandra Nielsen Date of Encounter: 05/09/2021  [facility] Cardiologist: None Clydie Braun  Subjective   After being seen on rounds yesterday she developed persistent SVT greater than 180 bpm and required adenosine.  We subsequently increased the dose of Toprol-XL to 25 mg twice daily.  She has been quiet overnight without significant recurrence of arrhythmia.  Inpatient Medications    Scheduled Meds:  aspirin EC  81 mg Oral Daily   atorvastatin  20 mg Oral Daily   clopidogrel  75 mg Oral Daily   clotrimazole   Topical BID   diltiazem  120 mg Oral BID   enoxaparin (LOVENOX) injection  40 mg Subcutaneous Q24H   flecainide  50 mg Oral BID   fluticasone furoate-vilanterol  1 puff Inhalation Daily   And   umeclidinium bromide  1 puff Inhalation Daily   leflunomide  20 mg Oral Daily   lidocaine  1 patch Transdermal Q24H   metoprolol succinate  25 mg Oral BID   nystatin   Topical BID   pantoprazole  40 mg Oral Daily   predniSONE  5 mg Oral Q breakfast   Continuous Infusions:  PRN Meds: acetaminophen **OR** acetaminophen (TYLENOL) oral liquid 160 mg/5 mL **OR** acetaminophen, levalbuterol, metoprolol tartrate   Vital Signs    Vitals:   05/08/21 2108 05/08/21 2353 05/09/21 0316 05/09/21 0756  BP: 117/70 (!) 118/59 105/61 126/69  Pulse: 74 74 70 67  Resp:  14 17 16   Temp:  97.8 F (36.6 C) 98.2 F (36.8 C) 97.8 F (36.6 C)  TempSrc:  Oral Oral Oral  SpO2:  100% 96% 98%  Weight:      Height:        Intake/Output Summary (Last 24 hours) at 05/09/2021 0855 Last data filed at 05/09/2021 0349 Gross per 24 hour  Intake 1792.74 ml  Output --  Net 1792.74 ml   Last 3 Weights 05/05/2021 09/27/2013 09/23/2013  Weight (lbs) 157 lb 13.6 oz 172 lb 8 oz 164 lb  Weight (kg) 71.6 kg 78.245 kg 74.39 kg      Telemetry    No sustained episodes of SVT.  PACs are noted but seem to be decreased in frequency.- Personally Reviewed  ECG     Not repeated- Personally Reviewed  Physical Exam  Sitting and on her iPhone GEN: No acute distress.   Neck: No JVD Cardiac: RRR, no murmurs, rubs, or gallops.  Respiratory: Clear to auscultation bilaterally. GI: Soft, nontender, non-distended  MS: No edema; No deformity. Neuro:  Nonfocal  Psych: Normal affect   Labs    High Sensitivity Troponin:  No results for input(s): TROPONINIHS in the last 720 hours.    Chemistry Recent Labs  Lab 05/05/21 1211 05/05/21 1219 05/07/21 1233 05/07/21 2239 05/08/21 0056  NA 135 135 135  --  133*  K 3.6 3.9 3.9 4.2 3.5  CL 98 98 100  --  101  CO2 26  --  25  --  25  GLUCOSE 172* 168* 103*  --  86  BUN 10 11 7*  --  8  CREATININE 0.90 0.70 0.83  --  0.77  CALCIUM 9.7  --  9.4  --  9.3  PROT 6.5  --   --   --   --   ALBUMIN 3.3*  --   --   --   --   AST 23  --   --   --   --  ALT 19  --   --   --   --   ALKPHOS 132*  --   --   --   --   BILITOT 0.7  --   --   --   --   GFRNONAA >60  --  >60  --  >60  ANIONGAP 11  --  10  --  7     Hematology Recent Labs  Lab 05/05/21 1211 05/05/21 1219  WBC 9.9  --   RBC 3.49*  --   HGB 12.6 13.3  HCT 38.0 39.0  MCV 108.9*  --   MCH 36.1*  --   MCHC 33.2  --   RDW 16.0*  --   PLT 293  --     BNPNo results for input(s): BNP, PROBNP in the last 168 hours.   DDimer No results for input(s): DDIMER in the last 168 hours.   Radiology    No results found.  Cardiac Studies   Echo demonstrates normal LVEF  Patient Profile     70 y.o. female  with a hx of SVT/PVCs, COPD, HLD, HTN, RA, and ongoing tobacco uise who was admitted with CVA and is being seen 05/07/2021 for the evaluation of SVT at the request of Dr. Sherryll Burger.  Assessment & Plan    Recurrent SVT at times greater than 210 bpm with left bundle branch block aberration: Conservative management in the setting of recent stroke.  Consider loop recorder to rule out episodes of A. fib.  Follow-up ASAP with Dr. Sampson Goon after discharge  to plan ongoing management of this problem.  We recommend medication regimen be unchanged from admission other than the increase in Toprol-XL to 25 mg twice daily.  Discussed with the patient CVA: Seems to tolerate the higher dose of beta-blocker without excessive hypotension. Tobacco abuse: Smoking cessation is imperative.  [facility] will sign off (again).   Medication Recommendations: Continue typical antiarrhythmic regimen as prior to admission. Other recommendations (labs, testing, etc): Follow-up with Dr. Clydie Braun for consideration of ablation and loop recorder to rule out silent A. fib as a source of stroke. Follow up as an outpatient: Follow-up with Dr. Sampson Goon  For questions or updates, please contact [facility] Please consult [email or web address] for contact info under        Signed, Lesleigh Noe, MD  05/09/2021, 8:55 AM

## 2021-05-09 NOTE — Plan of Care (Signed)
  Problem: Education: Goal: Knowledge of disease or condition will improve 05/09/2021 1232 by Coralie Common, RN Outcome: Adequate for Discharge 05/09/2021 1102 by Coralie Common, RN Outcome: Progressing Goal: Knowledge of secondary prevention will improve 05/09/2021 1232 by Coralie Common, RN Outcome: Adequate for Discharge 05/09/2021 1102 by Coralie Common, RN Outcome: Progressing Goal: Knowledge of patient specific risk factors addressed and post discharge goals established will improve 05/09/2021 1232 by Coralie Common, RN Outcome: Adequate for Discharge 05/09/2021 1102 by Coralie Common, RN Outcome: Progressing   Problem: Coping: Goal: Will identify appropriate support needs 05/09/2021 1232 by Coralie Common, RN Outcome: Adequate for Discharge 05/09/2021 1102 by Coralie Common, RN Outcome: Progressing   Problem: Self-Care: Goal: Ability to participate in self-care as condition permits will improve 05/09/2021 1232 by Coralie Common, RN Outcome: Adequate for Discharge 05/09/2021 1102 by Coralie Common, RN Outcome: Progressing Goal: Ability to communicate needs accurately will improve 05/09/2021 1232 by Coralie Common, RN Outcome: Adequate for Discharge 05/09/2021 1102 by Coralie Common, RN Outcome: Progressing   Problem: Education: Goal: Knowledge of General Education information will improve Description: Including pain rating scale, medication(s)/side effects and non-pharmacologic comfort measures 05/09/2021 1232 by Coralie Common, RN Outcome: Adequate for Discharge 05/09/2021 1102 by Coralie Common, RN Outcome: Progressing   Problem: Health Behavior/Discharge Planning: Goal: Ability to manage health-related needs will improve 05/09/2021 1232 by Coralie Common, RN Outcome: Adequate for Discharge 05/09/2021 1102 by Coralie Common, RN Outcome: Progressing   Problem: Clinical Measurements: Goal: Ability to maintain clinical measurements within  normal limits will improve 05/09/2021 1232 by Coralie Common, RN Outcome: Adequate for Discharge 05/09/2021 1102 by Coralie Common, RN Outcome: Progressing Goal: Will remain free from infection 05/09/2021 1232 by Coralie Common, RN Outcome: Adequate for Discharge 05/09/2021 1102 by Coralie Common, RN Outcome: Progressing Goal: Diagnostic test results will improve 05/09/2021 1232 by Coralie Common, RN Outcome: Adequate for Discharge 05/09/2021 1102 by Coralie Common, RN Outcome: Progressing Goal: Respiratory complications will improve 05/09/2021 1232 by Coralie Common, RN Outcome: Adequate for Discharge 05/09/2021 1102 by Coralie Common, RN Outcome: Progressing Goal: Cardiovascular complication will be avoided 05/09/2021 1232 by Coralie Common, RN Outcome: Adequate for Discharge 05/09/2021 1102 by Coralie Common, RN Outcome: Progressing   Problem: Activity: Goal: Risk for activity intolerance will decrease 05/09/2021 1232 by Coralie Common, RN Outcome: Adequate for Discharge 05/09/2021 1102 by Coralie Common, RN Outcome: Progressing   Problem: Nutrition: Goal: Adequate nutrition will be maintained 05/09/2021 1232 by Coralie Common, RN Outcome: Adequate for Discharge 05/09/2021 1102 by Coralie Common, RN Outcome: Progressing   Problem: Elimination: Goal: Will not experience complications related to bowel motility 05/09/2021 1232 by Coralie Common, RN Outcome: Adequate for Discharge 05/09/2021 1102 by Coralie Common, RN Outcome: Progressing Goal: Will not experience complications related to urinary retention 05/09/2021 1232 by Coralie Common, RN Outcome: Adequate for Discharge 05/09/2021 1102 by Coralie Common, RN Outcome: Progressing   Problem: Pain Managment: Goal: General experience of comfort will improve 05/09/2021 1232 by Coralie Common, RN Outcome: Adequate for Discharge 05/09/2021 1102 by Coralie Common, RN Outcome: Progressing    Problem: Skin Integrity: Goal: Risk for impaired skin integrity will decrease 05/09/2021 1232 by Coralie Common, RN Outcome: Adequate for Discharge 05/09/2021 1102 by Coralie Common, RN Outcome: Progressing

## 2021-05-09 NOTE — Progress Notes (Signed)
Occupational Therapy Treatment Patient Details Name: Sandra Nielsen MRN: [id number] DOB: 1951/02/15 Today's Date: 05/09/2021    History of present illness 70 yo female admitted with slurred speech L mouth droop and drooling. MRI acute R insula infarct  PMH: SVT, breast CA s/p R mastectomy, chronic bronchitis, RA, HLD, tobacco abuse, COVID, pelvis fx (03/11/2021),  wears glasses   OT comments  Patient doing well with all ADL tasks, supervision level for safety/line management ambulating in room and standing sink side for oral care, wash face, hands and peri area. Also able to doff soiled underwear in standing without loss of balance. Patient hopeful may get to go home today.    Follow Up Recommendations  No OT follow up    Equipment Recommendations  None recommended by OT       Precautions / Restrictions Precautions Precautions: Fall Restrictions Weight Bearing Restrictions: No       Mobility Bed Mobility               General bed mobility comments: in recliner    Transfers Overall transfer level: Modified independent Equipment used: None Transfers: Sit to/from Stand Sit to Stand: Modified independent (Device/Increase time)              Balance Overall balance assessment: Mild deficits observed, not formally tested                                         ADL either performed or assessed with clinical judgement   ADL Overall ADL's : Needs assistance/impaired     Grooming: Oral care;Wash/dry face;Wash/dry hands;Supervision/safety;Standing       Lower Body Bathing: Animator;Sit to/from stand Lower Body Bathing Details (indicate cue type and reason): to wash peri area in standing at sink side, unilateral support of UE on sink during bathing     Lower Body Dressing: Supervision/safety;Sit to/from stand Lower Body Dressing Details (indicate cue type and reason): patient able to doff soiled underwear in standing without loss of  balance, donned clean pair sitting in chair and supervision with sit to stand Toilet Transfer: Supervision/safety;Ambulation Toilet Transfer Details (indicate cue type and reason): able to ambulate in room without adaptive device, no loss of balance noted. supervision for safety/line management.         Functional mobility during ADLs: Supervision/safety General ADL Comments: patient reports hopeful to go home today      Cognition Arousal/Alertness: Awake/alert Behavior During Therapy: WFL for tasks assessed/performed Overall Cognitive Status: Within Functional Limits for tasks assessed                                 General Comments: Pt pleasant and conversational throughout session              General Comments VSS, no reports of dizziness throughout with soft BP RN aware/present    Pertinent Vitals/ Pain       Pain Assessment: No/denies pain         Frequency  Min 2X/week        Progress Toward Goals  OT Goals(current goals can now be found in the care plan section)  Progress towards OT goals: Progressing toward goals  Acute Rehab OT Goals Patient Stated Goal: to hopefully go home today OT Goal Formulation: With patient Time For Goal Achievement: 05/21/21 Potential to Achieve  Goals: Good ADL Goals Pt Will Perform Upper Body Dressing: with modified independence;sitting Pt Will Perform Lower Body Dressing: with modified independence;sit to/from stand Pt Will Transfer to Toilet: with modified independence;ambulating;regular height toilet Pt Will Perform Toileting - Clothing Manipulation and hygiene: with modified independence;sit to/from stand Pt Will Perform Tub/Shower Transfer: Tub transfer;ambulating;tub bench;rolling walker Additional ADL Goal #1: Pt will improve activity tolerance to perform 4 consecutive standing ADLs  Plan Discharge plan remains appropriate       AM-PAC OT "6 Clicks" Daily Activity     Outcome Measure   Help from  another person eating meals?: None Help from another person taking care of personal grooming?: A Little Help from another person toileting, which includes using toliet, bedpan, or urinal?: A Little Help from another person bathing (including washing, rinsing, drying)?: A Little Help from another person to put on and taking off regular upper body clothing?: A Little Help from another person to put on and taking off regular lower body clothing?: A Little 6 Click Score: 19    End of Session  OT Visit Diagnosis: Unsteadiness on feet (R26.81);Muscle weakness (generalized) (M62.81)   Activity Tolerance Patient tolerated treatment well   Patient Left in chair;with call bell/phone within reach;with nursing/sitter in room   Nurse Communication Other (comment) (RN present during treatment)        Time: 5409-8119 OT Time Calculation (min): 18 min  Charges: OT General Charges $OT Visit: 1 Visit OT Treatments $Self Care/Home Management : 8-22 mins  Marlyce Huge OT OT pager: [phone]   Carmelia Roller 05/09/2021, 12:17 PM

## 2021-05-09 NOTE — TOC Transition Note (Signed)
Transition of Care [facility]) - CM/SW Discharge Note   Patient Details  Name: Sandra Nielsen MRN: [id number] Date of Birth: 1951-08-09  [facility] Care [facility]) CM/SW Contact:  Kermit Balo, RN Phone Number: 05/09/2021, 12:18 PM   Clinical Narrative:    Patient discharging home with resumption of HH services through [place]. Amy with [facility] aware of d/c. No DME needs. Pt has transport home.   Final next level of care: Home w Home Health Services Barriers to Discharge: No Barriers Identified   Patient Goals and CMS Choice     Choice offered to / list presented to : Patient  Discharge Placement                       Discharge Plan and Services                          HH Arranged: PT, OT [facility] Agency: [facility] Date [facility] Agency Contacted: 05/09/21   Representative spoke with at [facility] Agency: Amy  Social Determinants of Health (SDOH) Interventions     Readmission Risk Interventions No flowsheet data found.

## 2021-05-09 NOTE — Discharge Summary (Signed)
Physician Discharge Summary  Sandra Nielsen WUJ:[id number] DOB: 06-05-1951 DOA: 05/05/2021  PCP: Shellia Cleverly, PA  Admit date: 05/05/2021 Discharge date: 05/09/2021  Admitted From: home Discharge disposition: home   Recommendations for Outpatient Follow-Up:   Home health PT Needs loop recorder Referral to neurology May need ablation for SVT-- needs very close follow up with cards aspirin 81 mg daily and clopidogrel 75 mg daily DAPT for 3 weeks and then aspirin alone   Discharge Diagnosis:   Principal Problem:   Acute stroke due to ischemia [facility]) Active Problems:   Chronic bronchitis (HCC)   Closed fracture of single pubic ramus of pelvis, initial encounter (HCC)   Dyslipidemia   Frequent PVCs   Rheumatoid arthritis with positive rheumatoid factor (HCC)   Tobacco abuse   Neck pain on right side   Intertriginous candidiasis   CVA (cerebral vascular accident) [facility])    Discharge Condition: Improved.  Diet recommendation: Low sodium, heart healthy.  Carbohydrate-modified.  Regular.  Wound care: None.  Code status: Full.   History of Present Illness:   Sandra Nielsen is a 70 y.o. female with medical history significant of SVT on flecainide, breast cancer s/p right mastectomy, chronic bronchitis, RA, HLD and tobacco abuse who presented to ER with slurred speech and drooling that started around 9AM this morning. She was sitting in her recliner and her son called her around 9:30 and noticed her speech was slurred. He knew something wasn't right so he called his sister. Her son drove over and noticed her left side of her face was droopy and she was drooling on the left side. She also had dysarthric speech so they called 911. The son states her speech would improve and then go back. He feels like she has much improved now since returning from her MRI. She denies any left arm or leg weakness. She denies any headache, vision changes, chest pain, palpitations, stomach pain,  N/V/D. No fever/chills. She has not been sick recently. Did have incidental finding of covid 7 weeks ago when she was admitted for a pelvis fracture.   She also has complained of right neck pain x 2 days, like she has crook in her neck. No she denies any weakness in her right arm or tingling down her arm or fingers.   She has had a cough recently, but at her baseline with smoking.    Hospital Course by Problem:   Acute stroke -MRI brain revealed small acute infarct right insula, negative head and neck MRA -Echo performed 7/24 with LVEF 60-65% and grade 1 diastolic dysfunction -Lower extremity DVT negative  -Continue aspirin, Plavix, Lipitor: now on aspirin 81 mg daily and clopidogrel 75 mg daily DAPT for 3 weeks and then aspirin alone -Implantable loop recorder will likely be placed by her cardiologist Dr. Sampson Goon  SVT-recurrent -Has history of SVT, is on flecainide, Toprol, Cardizem at home -Continue flecainide, Toprol, and home Cardizem -increased BB -Appreciate cardiology evaluation and recommendations, may require ablation if heart rates do not become better controlled on medications -replace K and Mg   Rheumatoid arthritis -Continue Arava, prednisone   GERD -Continue PPI       Medical Consultants:    Cards neurology  Discharge Exam:   Vitals:   05/09/21 0756 05/09/21 1020  BP: 126/69 (!) 104/58  Pulse: 67   Resp: 16   Temp: 97.8 F (36.6 C)   SpO2: 98%    Vitals:   05/08/21 2353 05/09/21 0316 05/09/21 0756  05/09/21 1020  BP: (!) 118/59 105/61 126/69 (!) 104/58  Pulse: 74 70 67   Resp: 14 17 16    Temp: 97.8 F (36.6 C) 98.2 F (36.8 C) 97.8 F (36.6 C)   TempSrc: Oral Oral Oral   SpO2: 100% 96% 98%   Weight:      Height:        General exam: Appears calm and comfortable.  The results of significant diagnostics from this hospitalization (including imaging, microbiology, ancillary and laboratory) are listed below for reference.     Procedures  and Diagnostic Studies:   DG Cervical Spine 2 or 3 views  Result Date: 05/05/2021 CLINICAL DATA:  Neck pain on the right side. EXAM: CERVICAL SPINE - 2-3 VIEW COMPARISON:  None. FINDINGS: There is no evidence of cervical spine fracture or prevertebral soft tissue swelling. 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 There are moderate to severe degenerative changes from C3-4 to C6-7. There is an anterior wedge deformity of the C4 vertebral body with less than 25% height loss which appears chronic. 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 No acute osseous injury. Electronically Signed   By: Romona Curls M.D.   On: 05/05/2021 16:19   MR ANGIO HEAD WO CONTRAST  Result Date: 05/05/2021 CLINICAL DATA:  Neuro deficit, acute, stroke suspected. Slurred speech. EXAM: MRI HEAD WITHOUT CONTRAST MRA HEAD WITHOUT CONTRAST MRA OF THE NECK WITHOUT AND WITH CONTRAST TECHNIQUE: Multiplanar, multi-echo pulse sequences of the brain and surrounding structures were acquired without intravenous contrast. Angiographic images of the Circle of Willis were acquired using MRA technique without intravenous contrast. Angiographic images of the neck were acquired using MRA technique without and with intravenous contrast. Carotid stenosis measurements (when applicable) are obtained utilizing NASCET criteria, using the distal internal carotid diameter as the denominator. CONTRAST:  7.3mL GADAVIST GADOBUTROL 1 MMOL/ML IV SOLN COMPARISON:  Head CT 05/05/2020 FINDINGS: MR HEAD FINDINGS Brain: There is a small acute infarct involving the right insula. Patchy T2 hyperintensities in the cerebral white matter and pons are nonspecific but compatible with moderate chronic small vessel ischemic disease. Mild cerebral atrophy is within normal limits for age. No intracranial hemorrhage, mass, midline shift, or extra-axial fluid collection is identified. Vascular: Major intracranial vascular flow voids are  preserved. Skull and upper cervical spine: Unremarkable bone marrow signal. Sinuses/Orbits: Unremarkable orbits. Moderate mucosal thickening in the left maxillary sinus. Small bilateral mastoid effusions. Other: None. MRA HEAD FINDINGS Anterior circulation: The internal carotid arteries are widely patent from skull base to carotid termini. The ACAs and MCAs are patent without evidence of a proximal branch occlusion or significant proximal stenosis. No aneurysm is identified. Posterior circulation: The included intracranial portions of the vertebral arteries are patent to the basilar with the left being strongly dominant. The basilar artery is widely patent. There are moderate-sized posterior communicating arteries bilaterally. Both PCAs are patent without evidence of a significant proximal stenosis. No aneurysm is identified. Anatomic variants: Duplicated middle cerebral arteries bilaterally. Hypoplastic right vertebral artery. MRA NECK FINDINGS The study is mildly motion degraded. Aortic arch: Normal variant 4 vessel aortic arch with the left vertebral artery arising from the arch. Widely patent arch vessel origins. Right carotid system: Patent without evidence of a significant stenosis or dissection. Left carotid system: Patent without evidence of a significant stenosis or dissection. Vertebral arteries: The vertebral arteries are patent with antegrade flow bilaterally and with the left being dominant. Motion artifact limits assessment of the vertebral artery origins,  however there is no evidence of a significant stenosis or dissection on either side more distally. IMPRESSION: 1. 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 2. Moderate chronic small vessel ischemic disease. 3. Negative head MRA. 4. Negative neck MRA within limitations of mild motion artifact. Electronically Signed   By: Sebastian Ache M.D.   On: 05/05/2021 15:11   MR ANGIO NECK W WO CONTRAST  Result Date: 05/05/2021 CLINICAL DATA:  Neuro  deficit, acute, stroke suspected. Slurred speech. EXAM: MRI HEAD WITHOUT CONTRAST MRA HEAD WITHOUT CONTRAST MRA OF THE NECK WITHOUT AND WITH CONTRAST TECHNIQUE: Multiplanar, multi-echo pulse sequences of the brain and surrounding structures were acquired without intravenous contrast. Angiographic images of the Circle of Willis were acquired using MRA technique without intravenous contrast. Angiographic images of the neck were acquired using MRA technique without and with intravenous contrast. Carotid stenosis measurements (when applicable) are obtained utilizing NASCET criteria, using the distal internal carotid diameter as the denominator. CONTRAST:  7.28mL GADAVIST GADOBUTROL 1 MMOL/ML IV SOLN COMPARISON:  Head CT 05/05/2020 FINDINGS: MR HEAD FINDINGS Brain: There is a small acute infarct involving the right insula. Patchy T2 hyperintensities in the cerebral white matter and pons are nonspecific but compatible with moderate chronic small vessel ischemic disease. Mild cerebral atrophy is within normal limits for age. No intracranial hemorrhage, mass, midline shift, or extra-axial fluid collection is identified. Vascular: Major intracranial vascular flow voids are preserved. Skull and upper cervical spine: Unremarkable bone marrow signal. Sinuses/Orbits: Unremarkable orbits. Moderate mucosal thickening in the left maxillary sinus. Small bilateral mastoid effusions. Other: None. MRA HEAD FINDINGS Anterior circulation: The internal carotid arteries are widely patent from skull base to carotid termini. The ACAs and MCAs are patent without evidence of a proximal branch occlusion or significant proximal stenosis. No aneurysm is identified. Posterior circulation: The included intracranial portions of the vertebral arteries are patent to the basilar with the left being strongly dominant. The basilar artery is widely patent. There are moderate-sized posterior communicating arteries bilaterally. Both PCAs are patent without  evidence of a significant proximal stenosis. No aneurysm is identified. Anatomic variants: Duplicated middle cerebral arteries bilaterally. Hypoplastic right vertebral artery. MRA NECK FINDINGS The study is mildly motion degraded. Aortic arch: Normal variant 4 vessel aortic arch with the left vertebral artery arising from the arch. Widely patent arch vessel origins. Right carotid system: Patent without evidence of a significant stenosis or dissection. Left carotid system: Patent without evidence of a significant stenosis or dissection. Vertebral arteries: The vertebral arteries are patent with antegrade flow bilaterally and with the left being dominant. Motion artifact limits assessment of the vertebral artery origins, however there is no evidence of a significant stenosis or dissection on either side more distally. IMPRESSION: 1. 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 2. Moderate chronic small vessel ischemic disease. 3. Negative head MRA. 4. Negative neck MRA within limitations of mild motion artifact. Electronically Signed   By: Sebastian Ache M.D.   On: 05/05/2021 15:11   MR BRAIN WO CONTRAST  Result Date: 05/05/2021 CLINICAL DATA:  Neuro deficit, acute, stroke suspected. Slurred speech. EXAM: MRI HEAD WITHOUT CONTRAST MRA HEAD WITHOUT CONTRAST MRA OF THE NECK WITHOUT AND WITH CONTRAST TECHNIQUE: Multiplanar, multi-echo pulse sequences of the brain and surrounding structures were acquired without intravenous contrast. Angiographic images of the Circle of Willis were acquired using MRA technique without intravenous contrast. Angiographic images of the neck were acquired using MRA technique without and with intravenous contrast. Carotid stenosis measurements (when applicable) are  obtained utilizing NASCET criteria, using the distal internal carotid diameter as the denominator. CONTRAST:  7.51mL GADAVIST GADOBUTROL 1 MMOL/ML IV SOLN COMPARISON:  Head CT 05/05/2020 FINDINGS: MR HEAD FINDINGS Brain: There  is a small acute infarct involving the right insula. Patchy T2 hyperintensities in the cerebral white matter and pons are nonspecific but compatible with moderate chronic small vessel ischemic disease. Mild cerebral atrophy is within normal limits for age. No intracranial hemorrhage, mass, midline shift, or extra-axial fluid collection is identified. Vascular: Major intracranial vascular flow voids are preserved. Skull and upper cervical spine: Unremarkable bone marrow signal. Sinuses/Orbits: Unremarkable orbits. Moderate mucosal thickening in the left maxillary sinus. Small bilateral mastoid effusions. Other: None. MRA HEAD FINDINGS Anterior circulation: The internal carotid arteries are widely patent from skull base to carotid termini. The ACAs and MCAs are patent without evidence of a proximal branch occlusion or significant proximal stenosis. No aneurysm is identified. Posterior circulation: The included intracranial portions of the vertebral arteries are patent to the basilar with the left being strongly dominant. The basilar artery is widely patent. There are moderate-sized posterior communicating arteries bilaterally. Both PCAs are patent without evidence of a significant proximal stenosis. No aneurysm is identified. Anatomic variants: Duplicated middle cerebral arteries bilaterally. Hypoplastic right vertebral artery. MRA NECK FINDINGS The study is mildly motion degraded. Aortic arch: Normal variant 4 vessel aortic arch with the left vertebral artery arising from the arch. Widely patent arch vessel origins. Right carotid system: Patent without evidence of a significant stenosis or dissection. Left carotid system: Patent without evidence of a significant stenosis or dissection. Vertebral arteries: The vertebral arteries are patent with antegrade flow bilaterally and with the left being dominant. Motion artifact limits assessment of the vertebral artery origins, however there is no evidence of a significant  stenosis or dissection on either side more distally. IMPRESSION: 1. 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 2. Moderate chronic small vessel ischemic disease. 3. Negative head MRA. 4. Negative neck MRA within limitations of mild motion artifact. Electronically Signed   By: Sebastian Ache M.D.   On: 05/05/2021 15:11   ECHOCARDIOGRAM COMPLETE BUBBLE STUDY  Result Date: 05/06/2021    ECHOCARDIOGRAM REPORT   Patient Name:   CHESSICA AUDIA Date of Exam: 05/06/2021 Medical Rec #:  [id number]      Height:       69.0 in Accession #:    [id number]     Weight:       157.8 lb Date of Birth:  September 01, 1951      BSA:          1.868 m Patient Age:    69 years       BP:           133/62 mmHg Patient Gender: F              HR:           81 bpm. Exam Location:  Inpatient Procedure: 2D Echo, Cardiac Doppler, Color Doppler and Saline Contrast Bubble            Study Indications:    Stroke 434.91/I63.9  History:        Patient has no prior history of Echocardiogram examinations.                 Signs/Symptoms:Dizziness/Lightheadedness.  Sonographer:    Roosvelt Maser RDCS Referring Phys: [phone] Malachi Carl STACK IMPRESSIONS  1. Left ventricular ejection fraction, by estimation, is 60 to 65%. The left ventricle has  normal function. The left ventricle has no regional wall motion abnormalities. Left ventricular diastolic parameters are consistent with Grade I diastolic dysfunction (impaired relaxation).  2. Right ventricular systolic function is normal. The right ventricular size is normal. There is normal pulmonary artery systolic pressure.  3. The mitral valve is normal in structure. Mild mitral valve regurgitation. No evidence of mitral stenosis.  4. The aortic valve is tricuspid. Aortic valve regurgitation is not visualized.  5. The inferior vena cava is normal in size with greater than 50% respiratory variability, suggesting right atrial pressure of 3 mmHg. FINDINGS  Left Ventricle: Left ventricular ejection fraction, by estimation,  is 60 to 65%. The left ventricle has normal function. The left ventricle has no regional wall motion abnormalities. The left ventricular internal cavity size was normal in size. There is  no left ventricular hypertrophy. Left ventricular diastolic parameters are consistent with Grade I diastolic dysfunction (impaired relaxation). Right Ventricle: The right ventricular size is normal. No increase in right ventricular wall thickness. Right ventricular systolic function is normal. There is normal pulmonary artery systolic pressure. Left Atrium: Left atrial size was normal in size. Right Atrium: Right atrial size was normal in size. Pericardium: There is no evidence of pericardial effusion. Mitral Valve: The mitral valve is normal in structure. Mild mitral valve regurgitation. No evidence of mitral valve stenosis. Tricuspid Valve: The tricuspid valve is normal in structure. Tricuspid valve regurgitation is mild . No evidence of tricuspid stenosis. Aortic Valve: The aortic valve is tricuspid. Aortic valve regurgitation is not visualized. Aortic valve mean gradient measures 4.0 mmHg. Aortic valve peak gradient measures 8.0 mmHg. Aortic valve area, by VTI measures 2.25 cm. Pulmonic Valve: The pulmonic valve was not well visualized. Pulmonic valve regurgitation is not visualized. No evidence of pulmonic stenosis. Aorta: The aortic root is normal in size and structure. Venous: The inferior vena cava is normal in size with greater than 50% respiratory variability, suggesting right atrial pressure of 3 mmHg. IAS/Shunts: No atrial level shunt detected by color flow Doppler. Agitated saline contrast was given intravenously to evaluate for intracardiac shunting.  LEFT VENTRICLE PLAX 2D LVIDd:         4.50 cm  Diastology LVIDs:         2.90 cm  LV e' medial:    7.40 cm/s LV PW:         1.00 cm  LV E/e' medial:  9.0 LV IVS:        1.00 cm  LV e' lateral:   8.38 cm/s LVOT diam:     1.90 cm  LV E/e' lateral: 8.0 LV SV:         62 LV  SV Index:   33 LVOT Area:     2.84 cm                          3D Volume EF:                         3D EF:        60 %                         LV EDV:       121 ml                         LV ESV:  48 ml                         LV SV:        73 ml RIGHT VENTRICLE RV Basal diam:  3.20 cm LEFT ATRIUM           Index       RIGHT ATRIUM           Index LA diam:      3.60 cm 1.93 cm/m  RA Area:     12.70 cm LA Vol (A2C): 40.7 ml 21.78 ml/m RA Volume:   28.00 ml  14.99 ml/m  AORTIC VALVE AV Area (Vmax):    2.23 cm AV Area (Vmean):   2.18 cm AV Area (VTI):     2.25 cm AV Vmax:           141.00 cm/s AV Vmean:          97.700 cm/s AV VTI:            0.274 m AV Peak Grad:      8.0 mmHg AV Mean Grad:      4.0 mmHg LVOT Vmax:         111.00 cm/s LVOT Vmean:        75.200 cm/s LVOT VTI:          0.217 m LVOT/AV VTI ratio: 0.79  AORTA Ao Root diam: 3.10 cm Ao Asc diam:  3.20 cm MITRAL VALVE MV Area (PHT): 4.39 cm     SHUNTS MV Decel Time: 173 msec     Systemic VTI:  0.22 m MV E velocity: 66.80 cm/s   Systemic Diam: 1.90 cm MV A velocity: 139.00 cm/s MV E/A ratio:  0.48 Dina Rich MD Electronically signed by Dina Rich MD Signature Date/Time: 05/06/2021/1:24:17 PM    Final    CT HEAD CODE STROKE WO CONTRAST  Result Date: 05/05/2021 CLINICAL DATA:  Code stroke. Neuro deficit, acute, stroke suspected. Left facial droop and slurred speech. EXAM: CT HEAD WITHOUT CONTRAST TECHNIQUE: Contiguous axial images were obtained from the base of the skull through the vertex without intravenous contrast. COMPARISON:  None. FINDINGS: Brain: There is no evidence of an acute infarct, intracranial hemorrhage, mass, midline shift, or extra-axial fluid collection. Hypodensities in the cerebral white matter bilaterally are nonspecific but compatible with mild chronic small vessel ischemic disease. Mild cerebral atrophy is within normal limits for age. Vascular: Calcified atherosclerosis at the skull base. No hyperdense  vessel. Skull: No fracture or suspicious osseous lesion. Sinuses/Orbits: Moderate mucosal thickening in the left maxillary sinus. Clear mastoid air cells. Unremarkable orbits. Other: None. ASPECTS [facility] Stroke Program Early CT Score) - Ganglionic level infarction (caudate, lentiform nuclei, internal capsule, insula, M1-M3 cortex): 7 - Supraganglionic infarction (M4-M6 cortex): 3 Total score (0-10 with 10 being normal): 10 IMPRESSION: 1. No evidence of acute intracranial abnormality. 2. ASPECTS is 10. 3. Mild chronic small vessel ischemic disease. These results were communicated to Dr. Selina Cooley at 12:27 pm on 05/05/2021 by text page via the [facility] messaging system. Electronically Signed   By: Sebastian Ache M.D.   On: 05/05/2021 12:27   VAS [place] LOWER EXTREMITY VENOUS (DVT)  Result Date: 05/07/2021  Lower Venous DVT Study Patient Name:  DANIJELA VESSEY  Date of Exam:   05/06/2021 Medical Rec #: [id number]       Accession #:    [id number] Date of Birth: 10-16-1950       Patient Gender: F  Patient Age:   44Y Exam Location:  [facility] Procedure:      VAS [place] LOWER EXTREMITY VENOUS (DVT) Referring Phys: [id number] Scheryl Marten XU --------------------------------------------------------------------------------  Indications: Stroke.  Comparison Study: No previous exams Performing Technologist: Jody Hill RVT, RDMS  Examination Guidelines: A complete evaluation includes B-mode imaging, spectral Doppler, color Doppler, and power Doppler as needed of all accessible portions of each vessel. Bilateral testing is considered an integral part of a complete examination. Limited examinations for reoccurring indications may be performed as noted. The reflux portion of the exam is performed with the patient in reverse Trendelenburg.  +---------+---------------+---------+-----------+----------+----------------+ RIGHT    CompressibilityPhasicitySpontaneityPropertiesThrombus Aging    +---------+---------------+---------+-----------+----------+----------------+ CFV      Full           Yes      Yes                                   +---------+---------------+---------+-----------+----------+----------------+ SFJ      Full                                                          +---------+---------------+---------+-----------+----------+----------------+ FV Prox  Full           Yes      Yes                                   +---------+---------------+---------+-----------+----------+----------------+ FV Mid   Full           Yes      Yes                                   +---------+---------------+---------+-----------+----------+----------------+ FV DistalFull           Yes      Yes                                   +---------+---------------+---------+-----------+----------+----------------+ PFV      Full                                                          +---------+---------------+---------+-----------+----------+----------------+ POP      Full           Yes      Yes                                   +---------+---------------+---------+-----------+----------+----------------+ PTV      Full                                                          +---------+---------------+---------+-----------+----------+----------------+ PERO  Not seen on exam +---------+---------------+---------+-----------+----------+----------------+   Right Technical Findings: Not visualized segments include peroneal veins.  +---------+---------------+---------+-----------+----------+-------------------+ LEFT     CompressibilityPhasicitySpontaneityPropertiesThrombus Aging      +---------+---------------+---------+-----------+----------+-------------------+ CFV      Full           Yes      Yes                                       +---------+---------------+---------+-----------+----------+-------------------+ SFJ      Full                                                             +---------+---------------+---------+-----------+----------+-------------------+ FV Prox  Full           Yes      Yes                                      +---------+---------------+---------+-----------+----------+-------------------+ FV Mid   Full           Yes      Yes                                      +---------+---------------+---------+-----------+----------+-------------------+ FV DistalFull           Yes      Yes                                      +---------+---------------+---------+-----------+----------+-------------------+ PFV      Full                                                             +---------+---------------+---------+-----------+----------+-------------------+ POP      Full           Yes      Yes                                      +---------+---------------+---------+-----------+----------+-------------------+ PTV      Full                                                             +---------+---------------+---------+-----------+----------+-------------------+ PERO     Full                                         Not well visualized +---------+---------------+---------+-----------+----------+-------------------+     Summary: BILATERAL: - No evidence of deep vein thrombosis seen  in the lower extremities, bilaterally. - No evidence of superficial venous thrombosis in the lower extremities, bilaterally. -No evidence of popliteal cyst, bilaterally.   *See table(s) above for measurements and observations. Electronically signed by Fabienne Bruns MD on 05/07/2021 at 4:33:53 PM.    Final      Labs:   Basic Metabolic Panel: Recent Labs  Lab 05/05/21 1211 05/05/21 1219 05/07/21 1233 05/07/21 2239 05/08/21 0056 05/09/21 0823  NA 135 135 135  --  133* 134*  K 3.6 3.9 3.9  4.2 3.5 3.9  CL 98 98 100  --  101 100  CO2 26  --  25  --  25 27  GLUCOSE 172* 168* 103*  --  86 104*  BUN 10 11 7*  --  8 7*  CREATININE 0.90 0.70 0.83  --  0.77 0.83  CALCIUM 9.7  --  9.4  --  9.3 9.3  MG  --   --   --  1.8 1.7 2.0   GFR Estimated Creatinine Clearance: 66.9 mL/min (by C-G formula based on SCr of 0.83 mg/dL). Liver Function Tests: Recent Labs  Lab 05/05/21 1211  AST 23  ALT 19  ALKPHOS 132*  BILITOT 0.7  PROT 6.5  ALBUMIN 3.3*   No results for input(s): LIPASE, AMYLASE in the last 168 hours. No results for input(s): AMMONIA in the last 168 hours. Coagulation profile Recent Labs  Lab 05/05/21 1211  INR 1.0    CBC: Recent Labs  Lab 05/05/21 1211 05/05/21 1219  WBC 9.9  --   NEUTROABS 8.1*  --   HGB 12.6 13.3  HCT 38.0 39.0  MCV 108.9*  --   PLT 293  --    Cardiac Enzymes: No results for input(s): CKTOTAL, CKMB, CKMBINDEX, TROPONINI in the last 168 hours. BNP: Invalid input(s): POCBNP CBG: Recent Labs  Lab 05/05/21 1209  GLUCAP 205*   D-Dimer No results for input(s): DDIMER in the last 72 hours. Hgb A1c No results for input(s): HGBA1C in the last 72 hours. Lipid Profile No results for input(s): CHOL, HDL, LDLCALC, TRIG, CHOLHDL, LDLDIRECT in the last 72 hours. Thyroid function studies No results for input(s): TSH, T4TOTAL, T3FREE, THYROIDAB in the last 72 hours.  Invalid input(s): FREET3 Anemia work up No results for input(s): VITAMINB12, FOLATE, FERRITIN, TIBC, IRON, RETICCTPCT in the last 72 hours. Microbiology Recent Results (from the past 240 hour(s))  SARS CORONAVIRUS 2 (TAT 6-24 HRS) Nasopharyngeal Nasopharyngeal Swab     Status: None   Collection Time: 05/05/21  2:08 PM   Specimen: Nasopharyngeal Swab  Result Value Ref Range Status   SARS Coronavirus 2 NEGATIVE NEGATIVE Final    Comment: (NOTE) SARS-CoV-2 target nucleic acids are NOT DETECTED.  The SARS-CoV-2 RNA is generally detectable in upper and lower respiratory  specimens during the acute phase of infection. Negative results do not preclude SARS-CoV-2 infection, do not rule out co-infections with other pathogens, and should not be used as the sole basis for treatment or other patient management decisions. Negative results must be combined with clinical observations, patient history, and epidemiological information. The expected result is Negative.  Fact Sheet for Patients: HairSlick.no  Fact Sheet for Healthcare Providers: quierodirigir.com  This test is not yet approved or cleared by the [place] FDA and  has been authorized for detection and/or diagnosis of SARS-CoV-2 by FDA under an Emergency Use Authorization (EUA). This EUA will remain  in effect (meaning this test can be used) for the duration of the  COVID-19 declaration under Se ction 564(b)(1) of the Act, 21 U.S.C. section 360bbb-3(b)(1), unless the authorization is terminated or revoked sooner.  Performed at [facility], [address] [place], [place] [address]      Discharge Instructions:   Discharge Instructions     Ambulatory referral to Neurology   Complete by: As directed    Follow up with stroke clinic NP (Zakhi Dupre Vanschaick or Darrol Angelarolyn Martin, if both not available, consider Manson AllanSethi, Penumali, or Ahern) at [facility] in about 4 weeks. Thanks.   Diet - low sodium heart healthy   Complete by: As directed    Discharge instructions   Complete by: As directed    aspirin 81 mg daily and clopidogrel 75 mg daily DAPT for 3 weeks and then aspirin alone Home health Recommend loop recorder placement with her cardiologist [facility]   Increase activity slowly   Complete by: As directed       Allergies as of 05/09/2021       Reactions   Meloxicam Swelling   Other reaction(s): Swelling   Penicillins Hives, Swelling   Levofloxacin Palpitations        Medication List     STOP taking these  medications    HYDROcodone-acetaminophen 5-325 MG tablet Commonly known as: NORCO/VICODIN   levofloxacin 500 MG tablet Commonly known as: LEVAQUIN   mupirocin cream 2 % Commonly known as: Bactroban   omeprazole 40 MG capsule Commonly known as: PRILOSEC   oxyCODONE-acetaminophen 5-325 MG tablet Commonly known as: Roxicet   valACYclovir 1000 MG tablet Commonly known as: Valtrex       TAKE these medications    aspirin 81 MG EC tablet Take 1 tablet (81 mg total) by mouth daily. Swallow whole. Start taking on: May 10, 2021   atorvastatin 20 MG tablet Commonly known as: LIPITOR Take 20 mg by mouth daily.   clopidogrel 75 MG tablet Commonly known as: PLAVIX Take 1 tablet (75 mg total) by mouth daily. Start taking on: May 10, 2021   diltiazem 120 MG 24 hr capsule Commonly known as: CARDIZEM CD Take 120 mg by mouth 2 (two) times daily.   flecainide 50 MG tablet Commonly known as: TAMBOCOR Take 50 mg by mouth 2 (two) times daily.   leflunomide 10 MG tablet Commonly known as: ARAVA Take 20 mg by mouth daily.   metoprolol succinate 25 MG 24 hr tablet Commonly known as: TOPROL-XL 25 mg in the AM and 12.5 mg q PM What changed:  how much to take how to take this when to take this additional instructions   MIRALAX PO Take 1 Bottle by mouth daily as needed (constipation).   pantoprazole 40 MG tablet Commonly known as: PROTONIX Take 40 mg by mouth daily.   predniSONE 5 MG tablet Commonly known as: DELTASONE Take 1 tablet (5 mg total) by mouth daily with breakfast. Start taking on: May 10, 2021 What changed:  medication strength how much to take how to take this when to take this additional instructions   Trelegy Ellipta 100-62.5-25 MCG/INH Aepb Generic drug: Fluticasone-Umeclidin-Vilant Inhale 1 puff into the lungs daily.        Follow-up Information     [facility]. Schedule an appointment as soon as possible for a visit in 1  month(s).   Specialty: Neurology Why: stroke clinic Contact information: [address] [place] [id number] [phone]        [facility] Follow up.  Specialty: Home Health Services Why: The home health agency will contact you for the next home visit. Contact information: [address] [place] [address] [phone]                  Time coordinating discharge: 35 min  Signed:  Joseph Art DO  [facility] 05/09/2021, 12:15 PM

## 2021-05-11 ENCOUNTER — Other Ambulatory Visit (HOSPITAL_BASED_OUTPATIENT_CLINIC_OR_DEPARTMENT_OTHER): Payer: Self-pay

## 2021-05-15 ENCOUNTER — Other Ambulatory Visit (HOSPITAL_COMMUNITY): Payer: Self-pay

## 2021-05-15 ENCOUNTER — Telehealth (HOSPITAL_COMMUNITY): Payer: Self-pay

## 2021-05-15 NOTE — Telephone Encounter (Signed)
Transitions of Care Pharmacy  ° °Call attempted for a pharmacy transitions of care follow-up. HIPAA appropriate voicemail was left with call back information provided.  ° °Call attempt #1. Will follow-up in 2-3 days.  °  °

## 2021-05-16 ENCOUNTER — Telehealth (HOSPITAL_COMMUNITY): Payer: Self-pay

## 2021-05-16 NOTE — Telephone Encounter (Signed)
Pharmacy Transitions of Care Follow-up Telephone Call  Date of discharge: 05/09/21  Discharge Diagnosis: Acute stroke  How have you been since you were released from the hospital?  Patient has been feeling weak since starting Plavix at discharge so has stopped taking it for 2 days. Since stopping Plavix patient has felt much better. MD is aware of patient weakness and stopping Plavix. Has follow up next week and will re-assess at that time.  Medication changes made at discharge:      START taking: aspirin  clopidogrel (PLAVIX)   CHANGE how you take: metoprolol succinate (TOPROL-XL)  predniSONE (DELTASONE)   STOP taking: HYDROcodone-acetaminophen 5-325 MG tablet (NORCO/VICODIN)  levofloxacin 500 MG tablet (LEVAQUIN)  mupirocin cream 2 % (Bactroban)  omeprazole 40 MG capsule (PRILOSEC)  oxyCODONE-acetaminophen 5-325 MG tablet (Roxicet)  valACYclovir 1000 MG tablet (Valtrex)   Medication changes verified by the patient? Yes    Medication Accessibility:  Home Pharmacy:  not discussed  Was the patient provided with refills on discharged medications? No   Have all prescriptions been transferred from [facility] to home pharmacy? N/A   Is the patient able to afford medications? Patient has AARPMPD    Medication Review:  Patient currently not taking Clopidogrel and will not restart so did not review medication with patient.   Follow-up Appointments:  PCP Hospital f/u appt confirmed?  Scheduled to see Dr. Shelle Iron on 07/19/21 @ [facility].   Specialist Hospital f/u appt confirmed?  Scheduled to see Dr. Sampson Goon on 05/23/21 @ [facility].   If their condition worsens, is the pt aware to call PCP or go to the Emergency Dept.? Yes  Final Patient Assessment: Patient has follow up scheduled and is being followed by Cardiology.

## 2021-05-19 MED FILL — Medication: Qty: 1 | Status: AC

## 2021-06-13 ENCOUNTER — Ambulatory Visit: Payer: Medicare Other | Admitting: Adult Health

## 2021-06-13 ENCOUNTER — Other Ambulatory Visit: Payer: Self-pay

## 2021-06-13 ENCOUNTER — Encounter: Payer: Self-pay | Admitting: Adult Health

## 2021-06-13 VITALS — BP 113/56 | HR 85 | Ht 68.0 in | Wt 156.0 lb

## 2021-06-13 DIAGNOSIS — E785 Hyperlipidemia, unspecified: Secondary | ICD-10-CM | POA: Diagnosis not present

## 2021-06-13 DIAGNOSIS — I639 Cerebral infarction, unspecified: Secondary | ICD-10-CM

## 2021-06-13 DIAGNOSIS — I10 Essential (primary) hypertension: Secondary | ICD-10-CM | POA: Diagnosis not present

## 2021-06-13 NOTE — Patient Instructions (Addendum)
Continue aspirin 81 mg daily  and atorvastatin 20 mg daily for secondary stroke prevention  Continue to follow with cardiology to discuss results of recent cardiac monitor. If negative, will recommend proceeding with loop recorder placement for longer duration of heart monitoring and to ensure no irregular rhythm called atrial fibrillation as cause of your recent stroke  Continue to follow up with PCP regarding cholesterol and blood pressure management  Maintain strict control of hypertension with blood pressure goal below 130/90 and cholesterol with LDL cholesterol (bad cholesterol) goal below 70 mg/dL.   Highly encourage complete tobacco cessation for secondary stroke prevention measures and overall health maintenance      Followup in the future with me in 6 months or call earlier if needed       Thank you for coming to see [place] at [facility]. I hope we have been able to provide you high quality care today.  You may receive a patient satisfaction survey over the next few weeks. We would appreciate your feedback and comments so that we may continue to improve ourselves and the health of our patients.    Stroke Prevention Some medical conditions and lifestyle choices can lead to a higher risk for a stroke. You can help to prevent a stroke by eating healthy foods and exercising. It also helps to not smoke and to manage any health problems you may have. How can this condition affect me? A stroke is an emergency. It should be treated right away. A stroke can lead to brain damage or threaten your life. There is a better chance of surviving and getting better after a stroke if you get medical help right away. What can increase my risk? The following medical conditions may increase your risk of a stroke: Diseases of the heart and blood vessels (cardiovascular disease). High blood pressure (hypertension). Diabetes. High cholesterol. Sickle cell disease. Problems with  blood clotting. Being very overweight. Sleeping problems (obstructivesleep apnea). Other risk factors include: Being older than age 32. A history of blood clots, stroke, or mini-stroke (TIA). Race, ethnic background, or a family history of stroke. Smoking or using tobacco products. Taking birth control pills, especially if you smoke. Heavy alcohol and drug use. Not being active. What actions can I take to prevent this? Manage your health conditions High cholesterol. Eat a healthy diet. If this is not enough to manage your cholesterol, you may need to take medicines. Take medicines as told by your doctor. High blood pressure. Try to keep your blood pressure below 130/80. If your blood pressure cannot be managed through a healthy diet and regular exercise, you may need to take medicines. Take medicines as told by your doctor. Ask your doctor if you should check your blood pressure at home. Have your blood pressure checked every year. Diabetes. Eat a healthy diet and get regular exercise. If your blood sugar (glucose) cannot be managed through diet and exercise, you may need to take medicines. Take medicines as told by your doctor. Talk to your doctor about getting checked for sleeping problems. Signs of a problem can include: Snoring a lot. Feeling very tired. Make sure that you manage any other conditions you have. Nutrition  Follow instructions from your doctor about what to eat or drink. You may be told to: Eat and drink fewer calories each day. Limit how much salt (sodium) you use to 1,500 milligrams (mg) each day. Use only healthy fats for cooking, such as olive oil, canola oil, and sunflower oil.  Eat healthy foods. To do this: Choose foods that are high in fiber. These include whole grains, and fresh fruits and vegetables. Eat at least 5 servings of fruits and vegetables a day. Try to fill one-half of your plate with fruits and vegetables at each meal. Choose low-fat (lean)  proteins. These include low-fat cuts of meat, chicken without skin, fish, tofu, beans, and nuts. Eat low-fat dairy products. Avoid foods that: Are high in salt. Have saturated fat. Have trans fat. Have cholesterol. Are processed or pre-made. Count how many carbohydrates you eat and drink each day. Lifestyle If you drink alcohol: Limit how much you have to: 0-1 drink a day for women who are not pregnant. 0-2 drinks a day for men. Know how much alcohol is in your drink. In the [place] one drink equals one 12 oz bottle of beer ( ), one 5 oz glass of wine ( ), or one 1 oz glass of hard liquor (10mL). Do not smoke or use any products that have nicotine or tobacco. If you need help quitting, ask your doctor. Avoid secondhand smoke. Do not use drugs. Activity  Try to stay at a healthy weight. Get at least 30 minutes of exercise on most days, such as: Fast walking. Biking. Swimming. Medicines Take over-the-counter and prescription medicines only as told by your doctor. Avoid taking birth control pills. Talk to your doctor about the risks of taking birth control pills if: You are over 5 years old. You smoke. You get very bad headaches. You have had a blood clot. Where to find more information American Stroke Association: www.strokeassociation.org Get help right away if: You or a loved one has any signs of a stroke. "BE FAST" is an easy way to remember the warning signs: B - Balance. Dizziness, sudden trouble walking, or loss of balance. E - Eyes. Trouble seeing or a change in how you see. F - Face. Sudden weakness or loss of feeling of the face. The face or eyelid may droop on one side. A - Arms. Weakness or loss of feeling in an arm. This happens all of a sudden and most often on one side of the body. S - Speech. Sudden trouble speaking, slurred speech, or trouble understanding what people say. T - Time. Time to call emergency services. Write down what time symptoms  started. You or a loved one has other signs of a stroke, such as: A sudden, very bad headache with no known cause. Feeling like you may vomit (nausea). Vomiting. A seizure. These symptoms may be an emergency. Get help right away. Call your local emergency services (911 in the [place] Do not wait to see if the symptoms will go away. Do not drive yourself to the hospital. Summary You can help to prevent a stroke by eating healthy, exercising, and not smoking. It also helps to manage any health problems you have. Do not smoke or use any products that contain nicotine or tobacco. Get help right away if you or a loved one has any signs of a stroke. This information is not intended to replace advice given to you by your health care provider. Make sure you discuss any questions you have with your health care provider. Document Revised: 05/01/2020 Document Reviewed: 05/01/2020 Elsevier Patient Education  2022 [employer].

## 2021-06-13 NOTE — Progress Notes (Signed)
[facility] [address] [place]. [place] [address] [phone]       HOSPITAL FOLLOW UP NOTE  Sandra Nielsen Date of Birth:  05-12-1951 Medical Record Number:  [id number]   Reason for Referral:  hospital stroke follow up    SUBJECTIVE:   CHIEF COMPLAINT:  Chief Complaint  Patient presents with   Follow-up    Rm 3 with daughter kim Pt is well and stable, symptoms have resolved, no current complications     HPI:   Sandra Nielsen is a 70 y.o. female with history of SVT on flecainide, breast cancer status post right mastectomy with no recurrence, and history of chronic bronchitis who presented on 05/05/2021 with acute onset left-sided weakness, dysarthria, and drooling.  Personally reviewed hospitalization pertinent progress notes, lab work and imaging.  Evaluated by Dr. 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 fib as etiology.  Recommend placement of loop recorder for further evaluation outpatient with established cardiologist.  MRA head/neck negative.  EF 60 to 65%.  LDL 57.3.  A1c 5.4.  UDS negative.  Recommended DAPT for 3 weeks and aspirin alone.  HTN stable.  Current tobacco use.  No prior stroke history.  Today, 06/13/2021, [name] is being seen for hospital follow-up accompanied by her daughter Selena Batten.  Overall doing well.  Denies any residual deficits.  Currently participating in PT for prior pelvic fracture ambulating with RW.  Remains on aspirin and atorvastatin.  Blood pressure today 113/56. F/u with cardiologist Dr. Sampson Goon 8/8 with note reviewed - per note, he had low suspicion that stroke was caused from "embolic stroke to 1 little area of the brain". She did completed 7 day cardiac monitor last week currently awaiting results.  Continued tobacco use.  No further neurological concerns at this time.     PERTINENT IMAGING  MR BRAIN  05/05/2021 MRA HEAD/NECK IMPRESSION: 1. 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 2. Moderate chronic small vessel ischemic disease. 3. Negative head MRA. 4. Negative neck MRA within limitations of mild motion artifact.  2D ECHO 05/06/2021 IMPRESSIONS   1. Left ventricular ejection fraction, by estimation, is 60 to 65%. The  left ventricle has normal function. The left ventricle has no regional  wall motion abnormalities. Left ventricular diastolic parameters are  consistent with Grade I diastolic  dysfunction (impaired relaxation).   2. Right ventricular systolic function is normal. The right ventricular  size is normal. There is normal pulmonary artery systolic pressure.   3. The mitral valve is normal in structure. Mild mitral valve  regurgitation. No evidence of mitral stenosis.   4. The aortic valve is tricuspid. Aortic valve regurgitation is not  visualized.   5. The inferior vena cava is normal in size with greater than 50%  respiratory variability, suggesting right atrial pressure of 3 mmHg.      ROS:   14 system review of systems performed and negative with exception of those listed in HPI  PMH:  Past Medical History:  Diagnosis Date   Female cystocele    History of breast cancer    S/P RIGHT MASTECTOMY / NO CHEMORADIATION/   NO RECURRENCE   History of chronic bronchitis    Smokers' cough (HCC)    Wears glasses     PSH:  Past Surgical History:  Procedure Laterality Date   CYSTOCELE REPAIR N/A 09/27/2013   Procedure: BOSTON SCIENTIFIC UPHOLD LITE SACROSPINOUS  LIGAMENT REPAIR , Rectocele Repair with Xenform, Cystocele repair with Xenform, Vaginal Vault Suspension, Joelene Millin;  Surgeon: Kathi Ludwig, MD;  Location: [facility];  Service: Urology;  Laterality: N/A;   LAPAROTOMY W/ BILATERAL SALPINGOOPHORECTOMY  JAN 1996   LEFT ANKLE RECONSTRUCTION  10-06-1979   MASTECTOMY WITH AXILLARY LYMPH NODE DISSECTION Right 02-16-1993    VAGINAL HYSTERECTOMY  1981    Social History:  Social History   Socioeconomic History   Marital status: Widowed    Spouse name: Not on file   Number of children: 3   Years of education: Not on file   Highest education level: Not on file  Occupational History   Occupation: Retired  Tobacco Use   Smoking status: Every Day    Packs/day: 1.00    Years: 40.00    Pack years: 40.00    Types: Cigarettes   Smokeless tobacco: Never  Vaping Use   Vaping Use: Never used  Substance and Sexual Activity   Alcohol use: No   Drug use: No   Sexual activity: Not on file  Other Topics Concern   Not on file  Social History Narrative   Not on file   Social Determinants of Health   Financial Resource Strain: Not on file  Food Insecurity: Not on file  Transportation Needs: Not on file  Physical Activity: Not on file  Stress: Not on file  Social Connections: Not on file  Intimate Partner Violence: Not on file    Family History: History reviewed. No pertinent family history.  Medications:   Current Outpatient Medications on File Prior to Visit  Medication Sig Dispense Refill   aspirin EC 81 MG EC tablet Take 1 tablet (81 mg total) by mouth daily. Swallow whole. 30 tablet 11   atorvastatin (LIPITOR) 20 MG tablet Take 20 mg by mouth daily.     diltiazem (CARDIZEM CD) 120 MG 24 hr capsule Take 120 mg by mouth 2 (two) times daily.     flecainide (TAMBOCOR) 50 MG tablet Take 50 mg by mouth 2 (two) times daily.     leflunomide (ARAVA) 10 MG tablet Take 20 mg by mouth daily.     metoprolol succinate (TOPROL-XL) 25 MG 24 hr tablet 25 mg in the AM and 12.5 mg q PM     pantoprazole (PROTONIX) 40 MG tablet Take 40 mg by mouth daily.     Polyethylene Glycol 3350 (MIRALAX PO) Take 1 Bottle by mouth daily as needed (constipation).     predniSONE (DELTASONE) 5 MG tablet Take 1 tablet (5 mg total) by mouth daily with breakfast.     TRELEGY ELLIPTA 100-62.5-25 MCG/INH AEPB Inhale 1 puff into the lungs  daily.     No current facility-administered medications on file prior to visit.    Allergies:   Allergies  Allergen Reactions   Meloxicam Swelling    Other reaction(s): Swelling    Penicillins Hives and Swelling   Levofloxacin Palpitations      OBJECTIVE:  Physical Exam  Vitals:   06/13/21 1258  BP: (!) 113/56  Pulse: 85  Weight: 156 lb (70.8 kg)  Height: 5\' 8"  (1.727 m)   Body mass index is 23.72 kg/m. No results found.  Post stroke PHQ 2/9 Depression screen PHQ 2/9 06/13/2021  Decreased Interest 0  Down, Depressed, Hopeless 0  PHQ - 2 Score 0     General: well developed, well nourished, very pleasant middle-age Caucasian female, seated, in no evident distress Head: head normocephalic  and atraumatic.   Neck: supple with no carotid or supraclavicular bruits Cardiovascular: regular rate and rhythm, no murmurs Musculoskeletal: no deformity Skin:  no rash/petichiae Vascular:  Normal pulses all extremities   Neurologic Exam Mental Status: Awake and fully alert.  Fluent speech and language.  Oriented to place and time. Recent and remote memory intact. Attention span, concentration and fund of knowledge appropriate. Mood and affect appropriate.  Cranial Nerves: Fundoscopic exam reveals sharp disc margins. Pupils equal, briskly reactive to light. Extraocular movements full without nystagmus. Visual fields full to confrontation. Hearing intact. Facial sensation intact. Face, tongue, palate moves normally and symmetrically.  Motor: Normal bulk and tone. Normal strength in all tested extremity muscles Sensory.: intact to touch , pinprick , position and vibratory sensation.  Coordination: Rapid alternating movements normal in all extremities. Finger-to-nose and heel-to-shin performed accurately bilaterally. Gait and Station: Arises from chair without difficulty. Stance is normal. Gait demonstrates normal stride length and balance with use of RW. Tandem walk and heel toe not  attempted.  Reflexes: 1+ and symmetric. Toes downgoing.     NIHSS  0 Modified Rankin  0      ASSESSMENT: Sandra Nielsen is a 70 y.o. 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 fib on 05/05/2021 after presenting with left-sided weakness, dysarthria and drooling. Vascular risk factors include SVT on flecainide, HTN, HLD, tobacco use and advanced age.      PLAN:  Right insula stroke:  Recovered well without residual deficit.   Zio patch 7 day monitor completed last week - if negative, would recommend pursuing loop recorder placement due to concern of embolic stroke due to location Continue aspirin 81 mg daily  and atorvastatin 20 mg daily for secondary stroke prevention.   Discussed secondary stroke prevention measures and importance of close PCP follow up for aggressive stroke risk factor management. I have gone over the pathophysiology of stroke, warning signs and symptoms, risk factors and their management in some detail with instructions to go to the closest emergency room for symptoms of concern. HTN: BP goal <130/90.  Stable on current regimen per PCP HLD: LDL goal <70. Recent LDL 57.3 on atorvastatin 20 mg daily per PCP.  Tobacco use: Complete tobacco cessation encouraged. F/u with PCP if further assistance is needed in quitting    Follow up in 6 months or call earlier if needed   CC:  [facility] provider: Dr. Pearlean Brownie PCP: Shellia Cleverly, [place]   Cardiologist: Brooke Pace, MD   I spent 53 minutes of face-to-face and non-face-to-face time with patient and daughter.  This included previsit chart review including review of recent hospitalization, lab review, study review, order entry, electronic health record documentation, patient and daughter education regarding recent stroke including potential etiology, secondary stroke prevention measures and  importance of managing stroke risk factors and answered all other questions to patient and daughters satisfaction  Ihor Austin, AGNP-BC  [facility] [address] [place], [place] [address]  Phone [phone] Fax [phone] Note: This document was prepared with digital dictation and possible smart phrase technology. Any transcriptional errors that result from this process are unintentional.

## 2021-12-12 ENCOUNTER — Ambulatory Visit: Payer: Medicare Other | Admitting: Adult Health
